# Patient Record
Sex: Male | Born: 1950 | Race: White | Hispanic: No | Marital: Married | Smoking: Never smoker
Health system: Southern US, Community
[De-identification: ages and names within clinical notes are randomized; demographics above are authoritative.]

## PROBLEM LIST (undated history)

## (undated) DIAGNOSIS — I10 Essential (primary) hypertension: Secondary | ICD-10-CM

## (undated) DIAGNOSIS — J45909 Unspecified asthma, uncomplicated: Secondary | ICD-10-CM

## (undated) DIAGNOSIS — G8929 Other chronic pain: Secondary | ICD-10-CM

## (undated) DIAGNOSIS — I469 Cardiac arrest, cause unspecified: Secondary | ICD-10-CM

## (undated) DIAGNOSIS — I509 Heart failure, unspecified: Secondary | ICD-10-CM

## (undated) DIAGNOSIS — D649 Anemia, unspecified: Secondary | ICD-10-CM

## (undated) DIAGNOSIS — I4891 Unspecified atrial fibrillation: Secondary | ICD-10-CM

## (undated) DIAGNOSIS — Z95810 Presence of automatic (implantable) cardiac defibrillator: Secondary | ICD-10-CM

## (undated) DIAGNOSIS — G4730 Sleep apnea, unspecified: Secondary | ICD-10-CM

## (undated) DIAGNOSIS — M5030 Other cervical disc degeneration, unspecified cervical region: Secondary | ICD-10-CM

## (undated) DIAGNOSIS — I428 Other cardiomyopathies: Secondary | ICD-10-CM

## (undated) HISTORY — PX: CARDIAC CATHETERIZATION: SHX172

## (undated) HISTORY — PX: LIPOMA EXCISION: SHX5283

## (undated) HISTORY — PX: HERNIA REPAIR: SHX51

## (undated) HISTORY — PX: INSERT / REPLACE / REMOVE PACEMAKER: SUR710

## (undated) HISTORY — PX: TONSILLECTOMY: SUR1361

---

## 2005-08-26 ENCOUNTER — Ambulatory Visit: Payer: Self-pay | Admitting: Nurse Practitioner

## 2006-12-13 ENCOUNTER — Emergency Department: Payer: Self-pay | Admitting: Emergency Medicine

## 2006-12-13 ENCOUNTER — Other Ambulatory Visit: Payer: Self-pay

## 2007-02-19 ENCOUNTER — Ambulatory Visit: Payer: Self-pay | Admitting: Internal Medicine

## 2010-01-25 ENCOUNTER — Inpatient Hospital Stay: Payer: Self-pay | Admitting: Internal Medicine

## 2011-05-26 DIAGNOSIS — G4730 Sleep apnea, unspecified: Secondary | ICD-10-CM | POA: Insufficient documentation

## 2011-05-26 DIAGNOSIS — M5030 Other cervical disc degeneration, unspecified cervical region: Secondary | ICD-10-CM | POA: Insufficient documentation

## 2011-05-26 DIAGNOSIS — E669 Obesity, unspecified: Secondary | ICD-10-CM | POA: Insufficient documentation

## 2011-05-26 DIAGNOSIS — I10 Essential (primary) hypertension: Secondary | ICD-10-CM | POA: Insufficient documentation

## 2011-05-26 DIAGNOSIS — E785 Hyperlipidemia, unspecified: Secondary | ICD-10-CM | POA: Insufficient documentation

## 2011-05-26 DIAGNOSIS — I4901 Ventricular fibrillation: Secondary | ICD-10-CM | POA: Insufficient documentation

## 2011-05-26 DIAGNOSIS — I428 Other cardiomyopathies: Secondary | ICD-10-CM | POA: Insufficient documentation

## 2011-08-29 HISTORY — PX: OTHER SURGICAL HISTORY: SHX169

## 2011-11-20 ENCOUNTER — Encounter: Payer: Self-pay | Admitting: Internal Medicine

## 2011-11-27 ENCOUNTER — Encounter: Payer: Self-pay | Admitting: Internal Medicine

## 2012-01-19 DIAGNOSIS — T82190A Other mechanical complication of cardiac electrode, initial encounter: Secondary | ICD-10-CM | POA: Insufficient documentation

## 2012-01-19 DIAGNOSIS — Z95810 Presence of automatic (implantable) cardiac defibrillator: Secondary | ICD-10-CM | POA: Insufficient documentation

## 2012-06-06 DIAGNOSIS — I4891 Unspecified atrial fibrillation: Secondary | ICD-10-CM | POA: Insufficient documentation

## 2012-08-08 ENCOUNTER — Ambulatory Visit: Payer: Self-pay | Admitting: Cardiology

## 2012-08-28 ENCOUNTER — Ambulatory Visit: Payer: Self-pay | Admitting: Cardiology

## 2012-09-17 LAB — BASIC METABOLIC PANEL
Anion Gap: 8 (ref 7–16)
BUN: 21 mg/dL — ABNORMAL HIGH (ref 7–18)
Chloride: 109 mmol/L — ABNORMAL HIGH (ref 98–107)
Co2: 20 mmol/L — ABNORMAL LOW (ref 21–32)
Creatinine: 0.85 mg/dL (ref 0.60–1.30)
Glucose: 111 mg/dL — ABNORMAL HIGH (ref 65–99)
Osmolality: 277 (ref 275–301)
Potassium: 4.1 mmol/L (ref 3.5–5.1)
Sodium: 137 mmol/L (ref 136–145)

## 2012-09-17 LAB — CBC
HGB: 13.5 g/dL (ref 13.0–18.0)
MCH: 32.5 pg (ref 26.0–34.0)
MCHC: 34.2 g/dL (ref 32.0–36.0)
MCV: 95 fL (ref 80–100)

## 2012-09-17 LAB — RAPID INFLUENZA A&B ANTIGENS

## 2012-09-18 ENCOUNTER — Inpatient Hospital Stay: Payer: Self-pay | Admitting: Internal Medicine

## 2012-09-18 LAB — URINALYSIS, COMPLETE
Bilirubin,UR: NEGATIVE
Blood: NEGATIVE
Glucose,UR: NEGATIVE mg/dL (ref 0–75)
Ketone: NEGATIVE
Leukocyte Esterase: NEGATIVE
Ph: 5 (ref 4.5–8.0)
Protein: NEGATIVE
Specific Gravity: 1.025 (ref 1.003–1.030)
WBC UR: 1 /HPF (ref 0–5)

## 2012-09-18 LAB — TROPONIN I: Troponin-I: 0.04 ng/mL

## 2012-09-19 LAB — CBC WITH DIFFERENTIAL/PLATELET
Basophil #: 0 10*3/uL (ref 0.0–0.1)
Eosinophil #: 0.5 10*3/uL (ref 0.0–0.7)
HGB: 13 g/dL (ref 13.0–18.0)
Lymphocyte %: 15 %
MCH: 32.8 pg (ref 26.0–34.0)
MCHC: 34.3 g/dL (ref 32.0–36.0)
Monocyte #: 1.2 x10 3/mm — ABNORMAL HIGH (ref 0.2–1.0)
Monocyte %: 11.9 %
Neutrophil #: 6.7 10*3/uL — ABNORMAL HIGH (ref 1.4–6.5)
Neutrophil %: 67.4 %
Platelet: 194 10*3/uL (ref 150–440)
RDW: 13.2 % (ref 11.5–14.5)
WBC: 9.9 10*3/uL (ref 3.8–10.6)

## 2012-09-19 LAB — BASIC METABOLIC PANEL
BUN: 9 mg/dL (ref 7–18)
Chloride: 108 mmol/L — ABNORMAL HIGH (ref 98–107)
Co2: 23 mmol/L (ref 21–32)
Creatinine: 0.83 mg/dL (ref 0.60–1.30)
EGFR (African American): >60
EGFR (Non-African Amer.): >60
Glucose: 92 mg/dL (ref 65–99)

## 2012-09-23 LAB — CULTURE, BLOOD (SINGLE)

## 2013-08-18 ENCOUNTER — Emergency Department: Payer: Self-pay | Admitting: Emergency Medicine

## 2013-08-18 LAB — COMPREHENSIVE METABOLIC PANEL
Albumin: 4.2 g/dL (ref 3.4–5.0)
Alkaline Phosphatase: 63 U/L
Anion Gap: 5 — ABNORMAL LOW (ref 7–16)
BUN: 23 mg/dL — ABNORMAL HIGH (ref 7–18)
Bilirubin,Total: 0.7 mg/dL (ref 0.2–1.0)
Chloride: 102 mmol/L (ref 98–107)
Co2: 31 mmol/L (ref 21–32)
Creatinine: 1.26 mg/dL (ref 0.60–1.30)
EGFR (African American): >60
EGFR (Non-African Amer.): >60
Potassium: 4.4 mmol/L (ref 3.5–5.1)
SGOT(AST): 15 U/L (ref 15–37)
SGPT (ALT): 22 U/L (ref 12–78)
Sodium: 138 mmol/L (ref 136–145)
Total Protein: 8.3 g/dL — ABNORMAL HIGH (ref 6.4–8.2)

## 2013-08-18 LAB — CBC
HCT: 43 % (ref 40.0–52.0)
HGB: 13.9 g/dL (ref 13.0–18.0)
MCH: 31 pg (ref 26.0–34.0)
MCHC: 32.3 g/dL (ref 32.0–36.0)
MCV: 96 fL (ref 80–100)
RBC: 4.48 10*6/uL (ref 4.40–5.90)
WBC: 23.2 10*3/uL — ABNORMAL HIGH (ref 3.8–10.6)

## 2013-08-18 LAB — CLOSTRIDIUM DIFFICILE(ARMC)

## 2013-08-18 LAB — LIPASE, BLOOD: Lipase: 208 U/L (ref 73–393)

## 2013-08-20 LAB — STOOL CULTURE

## 2014-07-24 ENCOUNTER — Emergency Department: Payer: Self-pay | Admitting: Emergency Medicine

## 2014-08-25 DIAGNOSIS — Z7901 Long term (current) use of anticoagulants: Secondary | ICD-10-CM | POA: Insufficient documentation

## 2014-09-28 DIAGNOSIS — D649 Anemia, unspecified: Secondary | ICD-10-CM | POA: Insufficient documentation

## 2014-09-28 DIAGNOSIS — R739 Hyperglycemia, unspecified: Secondary | ICD-10-CM | POA: Insufficient documentation

## 2014-09-28 DIAGNOSIS — J4520 Mild intermittent asthma, uncomplicated: Secondary | ICD-10-CM | POA: Insufficient documentation

## 2014-12-18 NOTE — H&P (Signed)
PATIENT NAME:  Henry Moreno, Henry Moreno MR#:  [id number] DATE OF BIRTH:  August 06, 1951  PRIMARY CARE PHYSICIAN:  Dr. Ramonita Lab   REFERRING PHYSICIAN:  Dr. Marjean Donna   CHIEF COMPLAINT:  Syncope.   HISTORY OF PRESENT ILLNESS:  This is a 64 year old male with history of dilated cardiomyopathy, status post V-fib arrest in 2011, status post biventricular pacer, ICD implantation at [facility].  By then he had an ejection fraction of 20%, last known ejection fraction 42% in October of last year, as well a recent diagnosis of A fib, started on anticoagulation with Eliquis; history of asthma, hypertension, hyperlipidemia, anemia, presents with syncope.  The patient reports he developed diarrhea yesterday.  He reports multiple family members had diarrhea over the last few days, mainly his wife, his son and other family members.  The patient reports yesterday afternoon he had a soft bowel movement x 2, then he started having profuse watery diarrhea, multiple episodes.  He started to feel dizzy and lightheaded.  He was at the bathroom at work when he had significant watery bowel movement and reports syncope, loss of consciousness and fall where he hit his right forehead area.  The patient was hypotensive initially with blood pressure 60/40, received a total of 3 liters IV normal saline, where blood pressure went up to 90/60.  The patient's first troponin was negative.  The patient had significant leukocytosis of 21,000.  CT of abdomen and pelvis with IV contrast done and did show only fluid and nondilated colon consistent with diarrhea and diverticulosis.  Hospitalist services were requested to admit the patient for appropriate hydration and further management and workup for syncope and diarrhea.    The patient reports he was recently started on Eliquis in October of last year, where he is being followed with [facility], as the interpretation of his AICD device showing him having some atrial fibrillation episodes, so he was  started on Eliquis for anticoagulation.   PAST MEDICAL HISTORY: 1.  Dilated cardiomyopathy, with last ejection fraction 42% as patient reporting, which was done at [facility] cardiology.  2.  Status post V-fib arrest in 2011, with status post AICD implantation in June 2011.  3.  Sleep apnea, on CPAP.  4.  Hyperlipidemia.  5.  Asthma. 6.  Hypertension.   PAST SURGICAL HISTORY:  1.  Hernia repair.  2.  Septoplasty.  3.  Tonsillectomy.  4.  AICD placement.   ALLERGIES:  IBUPROFEN AND ACE INHIBITORS.   HOME MEDICATIONS: 1.  Diovan 80 mg oral  2.  Zyrtec 10 mg oral daily.  3.  Coreg 12.5 mg p.o. b.i.d.  4.  Advair Diskus 1 puff 2 times a day.  5.  Multivitamin 1 tablet oral daily.  6.  Vitamin D3 at 2000 units oral daily.  7.  Eliquis 5 mg oral 2 times a day.  8.  Norco 1 to 2 tablets 4 times a day as needed.  9.  Zofran as needed.   SOCIAL HISTORY:  No alcohol, tobacco or illicit drug use.   FAMILY HISTORY:  The patient is adopted, unaware of the history of the family.   REVIEW OF SYSTEMS:  CONSTITUTIONAL:  The patient denies any fever, chills.  Complains of fatigue, generalized weakness.  EYES:  Denies blurry vision, double vision, inflammation or glaucoma.  EARS, NOSE, THROAT:  Denies tinnitus, ear pain, epistaxis or nasal discharge.  RESPIRATORY:  Denies cough, wheezing, hemoptysis, dyspnea, COPD.  CARDIOVASCULAR:  Denies chest pain, edema, palpitation.  Had syncope.  GASTROINTESTINAL:  Has complaints of nausea, diarrhea, abdominal pain.  Denies vomiting, hematemesis, jaundice, rectal bleed.  GENITOURINARY:  Denies dysuria, hematuria, renal colic.  ENDOCRINE:  Denies polyuria, polydipsia, heat or cold intolerance.  HEMATOLOGY:  Denies anemia, easy bruising, bleeding diathesis.  MUSCULOSKELETAL:  Denies any arthritis, cramps, swelling, gout.  NEUROLOGIC:  Denies CVA, TIA, seizures, dementia, headache, vertigo, ataxia.  PSYCHIATRIC:  Denies anxiety, insomnia, schizophrenia,  substance or alcohol abuse.   PHYSICAL EXAMINATION: VITAL SIGNS:  Temperature 99.5, pulse 79, respiratory rate 16, blood pressure 109/66, saturating 96% on room air.  GENERAL:  Well-nourished male, looks comfortable in bed, in no apparent distress.  HEENT:  Has right laceration in the eyebrow area, no active bleed.  Pupils equal, reactive to light.  Pink conjunctivae.  Anicteric sclerae.  Moist oral mucosa.  NECK:  Supple.  No thyromegaly.  No JVD.  CHEST:  Good air entry bilaterally.  No wheezing, rales, rhonchi.  CARDIOVASCULAR:  S1, S2 heard.  No rubs, murmur, gallops.  ABDOMEN:  Soft, nontender, nondistended.  Bowel sounds present.  EXTREMITIES:  No edema.  No clubbing.  No cyanosis.  PSYCHIATRIC:  Awake, alert, oriented x 3.  Intact judgment and insight.  NEUROLOGIC:  Cranial nerves grossly intact.  Motor 5 out of 5.  No focal deficits.  SKIN:  Normal skin turgor.  Warm and dry.  No rash.   PERTINENT LABORATORY DATA:  Glucose 111, BUN 21, creatinine 0.85, sodium 137, potassium 4.1, chloride 109, CO2 of 20, troponin 0.03.  White blood cells 21.4, hemoglobin 13.5, hematocrit 39.5, platelets 229.  Urinalysis negative.  CT abdomen and pelvis showing fluid in the nondilated colon consistent with diarrhea, diverticulosis, descending and sigmoid colon without evidence of diverticulitis.   ASSESSMENT AND PLAN: 1.  Syncope.  This is most likely related to volume depletion from significant watery diarrhea.  We will continue with IV hydration, but we will be very careful as patient is known to have history of cardiomyopathy.  As well, we will continue to hold his antihypertensive medication.  We will have him on telemonitor, and we will continue to cycle his troponins and follow that trend.  2.  Diarrhea.  This is most likely viral, as multiple family members have similar complaints and as per patient, it resolved within 24 hours.  CT abdomen and pelvis does not show any colitis, so we will not start  any antibiotic for now.  We will send for stool workup, culture and sensitivity, white blood cell and C. difficile.  Once it is negative, can be started on loperamide.  3.  Leukocytosis.  This is most likely related to his diarrhea.  Urinalysis is negative.  We will follow on the blood cultures.  4.  Recent diagnosis of atrial fibrillation.  The patient is on anticoagulation with Eliquis; will be resumed in a.m.  5.  Cardiomyopathy.  Will continue with gentle hydration.  Will be very careful with hydration due to cardiomyopathy.  Will resume beta-blockers and Diovan when the patient is stable.  The patient is status post automatic implantable cardiac defibrillator  6.  Hypertension.  Will hold all meds due to hypotensive and volume depletion.  7.  Deep vein thrombosis prophylaxis.  The patient is on full-dose anticoagulation with Eliquis.  8.  Gastrointestinal prophylaxis.  Will start on PPI.  9.  CODE STATUS:  FULL CODE.   TOTAL TIME SPENT ON ADMISSION AND PATIENT CARE:  60 minutes.    ____________________________ Albertine Patricia, MD dse:ea D:  09/18/2012 06:11:00 ET T: 09/18/2012 07:09:28 ET JOB#: [id number]  cc: Albertine Patricia, MD, <Dictator> Edvardo Honse Graciela Husbands MD ELECTRONICALLY SIGNED 09/19/2012 0:51

## 2014-12-18 NOTE — Discharge Summary (Signed)
PATIENT NAME:  Henry Moreno, Henry Moreno MR#:  [id number] DATE OF BIRTH:  1950/12/22  DATE OF ADMISSION:  09/18/2012 DATE OF DISCHARGE:  09/19/2012  FINAL DIAGNOSES: 1. Syncope secondary to dehydration.  2. Dehydration secondary to viral gastroenteritis.  3. Chronic left-sided systolic congestive heart failure.  4. History of paroxysmal atrial fibrillation.  5. Remote history of ventricular fibrillation arrest, status post AICD implantation.  6. Sleep apnea, on CPAP.  7. Hyperlipidemia.  8. Reactive airway disease.  9. Hypertension.   HISTORY AND PHYSICAL: Please see dictated admission history and physical.   [place]: The patient was admitted after an episode of syncope and had been found to have hypotension, systolic blood pressure in the 60 range. His normal systolic blood pressure is 90 to 110. He was placed on IV fluids, with improvement in his blood pressure. His blood pressure medications were initially held. Cardiac enzymes were followed, which were unremarkable. Cardiology consultation was obtained, and AICD was interrogated, showing no evidence of dysrhythmia. He had had nausea, vomiting and diarrhea at home, and in fact was having diarrhea when he had his syncopal episode. His wife and son both had similar symptoms over the prior 48 hours. During the patient's hospitalization he had no diarrhea, no further nausea. He was tolerating diet. I ambulated him personally over 110 feet and he had no issues with this. He felt ready to return home, so at this time he will be discharged home in stable condition. His physical activity will be up as tolerated. He will follow a 2 gram sodium diet, although he should keep his diet bland for the next 3 to 4 days. He should weigh himself daily, calling for more than 2 pound gain in one day or 5 pounds in one week or increasing signs or symptoms of heart failure including weight gain, fatigue, edema, dyspnea, and he is familiar with these symptoms.    DISCHARGE MEDICATIONS: 1. Advair 100/50 one puff 2 times a day. 2. Carvedilol 12.5 mg p.o. 2 times a day. 3. Eliquis 5 mg p.o. 2 times a day. 4. Vitamin D3 2000 units p.o. twice a day.  5. Zyrtec 10 mg p.o. daily.  6. Norco 5/325 mg 1 to 2 p.o. every 4 hours p.r.n. pain. 7. Multivitamin 1 p.o. daily.  8. Diovan 80 mg p.o. daily, frequency reduced.  9. Pantoprazole 40 mg daily x 7 days.   The patient will follow up with [place] in 7 to 10 days. He knows to call or return sooner for new or worsening symptoms. He may return to work on Monday, 09/23/2012.  ____________________________ [name], MD bjk:sb D: 09/19/2012 08:01:39 ET T: 09/19/2012 08:23:54 ET JOB#: [id number]  cc: [name], MD, <Dictator> Ramonita Lab MD ELECTRONICALLY SIGNED 09/23/2012 19:58

## 2014-12-18 NOTE — Consult Note (Signed)
General Aspect 64 year old male with history of normal coronary arteries, nonischemic cardiomyopathy with EF of 30%, status post V. fib arrest status post ICD, history of sleep apnea that was hospitalized for syncope yesterday. Several family members have had GI distress, thought to be viral. The patient woke yesterday a.m. feeling fine and ate his usual breakfast.  Midmorning had a loose stool.  At lunch, was feeling queasy and did not eat lunch.  Midafternoon had several watery stools with syncope..  Patient did start feeling weak, lightheaded and then was aware  of being in th  the floor. He was quite hypotensive on presentation to the ER, but his blood pressure did improve after fluids.  He does have a small bruise  the side of his left eye where he hit his head. He is currently eating some of this lunch, but his appetite is still not back to usual.  His wife is concerned because he looks pale. No defibrillator firings.   Physical Exam:   GEN no acute distress, Pale but warm and dry.    HEENT pink conjunctivae, moist oral mucosa    RESP normal resp effort  clear BS    CARD Regular rate and rhythm    EXTR negative edema    SKIN normal to palpation    NEURO cranial nerves intact, motor/sensory function intact    PSYCH alert, A+O to time, place, person, good insight   Review of Systems:   Subjective/Chief Complaint None at present    Cardiovascular: No Complaints    Gastrointestinal: Diarrhea  Queasy    ROS Pt not able to provide ROS    Medications/Allergies Reviewed Medications/Allergies reviewed   Radiology Results: XRay:    21-Jan-14 23:36, Chest PA and Lateral   Chest PA and Lateral    REASON FOR EXAM:    PERSISTENT HYPOTENSION  COMMENTS:       PROCEDURE: DXR - DXR CHEST PA (OR AP) AND LATERAL  - Sep 17 2012 11:36PM     RESULT: Comparison: 01/25/2010    Findings:  The heart and mediastinum are stable. Interval placement of multilead  AICD wires. No focal pulmonary  opacities.    IMPRESSION:   No acute cardiopulmonary disease.    Dictation site: 2    Verified By: Gregor Hams, M.D., MD  CT:    21-Jan-14 23:36, CT Abdomen and Pelvis With Contrast   CT Abdomen and Pelvis With Contrast    REASON FOR EXAM:    (1) ABD PAIN AND LOW BLOOD PRESSURE; (2) ABD PAIN  COMMENTS:       PROCEDURE: CT  - CT ABDOMEN / PELVIS  W  - Sep 17 2012 11:36PM     RESULT:     TECHNIQUE: Emergent CT of the abdomen and pelvis is performed with 100 ml   of Isovue-300 iodinated intravenous contrast. Images are reconstructed at   3.0 mm slice thickness in the axial plane.     There is no previous exam for comparison.     FINDINGS: There is dependent atelectasis in the lower lobes. The lung   bases are otherwise clear and fully inflated. The liver, spleen,     pancreas, gallbladder, abdominal aorta and adrenal glands appear to be   within normal limits. The kidneys show no hydronephrosis. There is a   small peripheral cyst laterally in the mid to upper leftkidney measuring   approximately 1.7 cm with a similar sized cyst posteriorly in the mid   left  kidney. The right kidney shows no solid or cystic mass. No stones   are seen in either kidney. The stomach contains some fluid. There is   fluid within loops of small and large bowel without formed stool seen in   the colon. The urinary bladder and prostate appear unremarkable.   Scattered colonic diverticulosis is seen in the descending colon and   sigmoid colon regions without evidence of acute diverticulitis. No   adenopathy or evidence of hemorrhage is seen. There is no bowel   obstruction or perforation evident. No radiopaque gallstones are evident.    IMPRESSION:    1. Scattered colonic diverticulosis.   2. Left renal cysts.   3. Bilateral lung base atelectasis.    Dictation Site: 1      Thank you for this opportunity to contribute to the care of your patient.         Verified By: Sundra Aland,  M.D., MD    ASA: Hives  [place]bu: Rash  Sulfa drugs: Unknown  Ace Inhibitors: Unknown  Vital Signs/Nurse's Notes: **Vital Signs.:   22-Jan-14 11:15   Vital Signs Type Routine   Temperature Temperature (F) 98.1   Celsius 36.7   Temperature Source Oral   Pulse Pulse 83   Respirations Respirations 18   Systolic BP Systolic BP 790   Diastolic BP (mmHg) Diastolic BP (mmHg) 62   Pulse Lying Pulse Lying 83   Systolic BP Systolic BP 240   Diastolic BP (mmHg) Diastolic BP (mmHg) 62   Pulse Pulse Sitting 81   Systolic BP Systolic BP 973   Diastolic BP (mmHg) Diastolic BP (mmHg) 66   Pulse Standing Pulse Standing 72   Pulse Ox % Pulse Ox % 91   Pulse Ox Activity Level  At rest   Oxygen Delivery Room Air/ 21 %     Impression 64 year old male with known ischemic cardiomyopathy, status post V. fib arrest and defibrillator placement with syncope thought to be due to volume depletion from diarrhea and GI virus but needing further cardiac evaluation with defibrillator interrogation to assess for any  arrhythmia  that could have contributed to syncope.    Plan 1. Request has been placed for [employer] rep to interrogate device.  Further recommendations once interrogation results are known. 2.  Otherwise, continue current therapy for probable volume depletion from viral illness.  Patient was seen in collaboration with Dr. Nehemiah Massed.   Electronic Signatures: Roderic Palau (NP)  (Signed 22-Jan-14 14:57)  Authored: General Aspect/Present Illness, History and Physical Exam, Review of System, Radiology, Allergies, Vital Signs/Nurse's Notes, Impression/Plan   Last Updated: 22-Jan-14 14:57 by Roderic Palau (NP)

## 2015-10-08 DIAGNOSIS — I5042 Chronic combined systolic (congestive) and diastolic (congestive) heart failure: Secondary | ICD-10-CM | POA: Insufficient documentation

## 2015-11-10 ENCOUNTER — Encounter: Payer: 59 | Attending: Internal Medicine | Admitting: *Deleted

## 2015-11-10 VITALS — Ht 67.0 in | Wt 227.0 lb

## 2015-11-10 DIAGNOSIS — I5042 Chronic combined systolic (congestive) and diastolic (congestive) heart failure: Secondary | ICD-10-CM | POA: Diagnosis present

## 2015-11-11 NOTE — Patient Instructions (Signed)
Patient Instructions  Patient Details  Name: Shaquon Mcclenney MRN: [id number] Date of Birth: October 14, 1950 Referring Provider:  [name], MD  Below are the personal goals you chose as well as exercise and nutrition goals. Our goal is to help you keep on track towards obtaining and maintaining your goals. We will be discussing your progress on these goals with you throughout the program.  Initial Exercise Prescription:     Initial Exercise Prescription - 11/10/15 1600    Date of Initial Exercise Prescription   Date 11/10/15   Treadmill   MPH 2   Grade 0   Minutes 15   Bike   Level 1   Minutes 15   Recumbant Bike   Level 2   RPM 15   Minutes 15   NuStep   Level 2   Watts 30   Minutes 15   Recumbant Elliptical   Level 2   RPM 30   Minutes 15   REL-XR   Level 2   Watts 40   Minutes 15   T5 Nustep   Level 2   Watts 30   Minutes 15   Biostep-RELP   Level 2   Watts 30   Minutes 15   Prescription Details   Frequency (times per week) 3   Duration Progress to 45 minutes of aerobic exercise without signs/symptoms of physical distress   Intensity   THRR REST +  30   Ratings of Perceived Exertion 11-15   Progression   Progression Continue to progress workloads to maintain intensity without signs/symptoms of physical distress.   Resistance Training   Training Prescription Yes   Weight 3   Reps 10-15      Exercise Goals: Frequency: Be able to perform aerobic exercise three times per week working toward 3-5 days per week.  Intensity: Work with a perceived exertion of 11 (fairly light) - 15 (hard) as tolerated. Follow your new exercise prescription and watch for changes in prescription as you progress with the program. Changes will be reviewed with you when they are made.  Duration: You should be able to do 30 minutes of continuous aerobic exercise in addition to a 5 minute warm-up and a 5 minute cool-down routine.  Nutrition Goals: Your personal nutrition  goals will be established when you do your nutrition analysis with the dietician.  The following are nutrition guidelines to follow: Cholesterol < 200mg /day Sodium < 1500mg /day Fiber: Men over 50 yrs - 30 grams per day  Personal Goals:     Personal Goals and Risk Factors at Admission - 11/11/15 0951    Core Components/Risk Factors/Patient Goals on Admission    Weight Management Yes;Obesity   Intervention Weight Management: Develop a combined nutrition and exercise program designed to reach desired caloric intake, while maintaining appropriate intake of nutrient and fiber, sodium and fats, and appropriate energy expenditure required for the weight goal.;Weight Management: Provide education and appropriate resources to help participant work on and attain dietary goals.;Weight Management/Obesity: Establish reasonable short term and long term weight goals.;Obesity: Provide education and appropriate resources to help participant work on and attain dietary goals.   Admit Weight 220 lb (99.791 kg)   Goal Weight: Short Term 216 lb (97.977 kg)   Goal Weight: Long Term 174 lb (78.926 kg)   Expected Outcomes Short Term: Continue to assess and modify interventions until short term weight is achieved.;Long Term: Adherence to nutrition and physical activity/exercise program aimed toward attainment of established weight goal.  Sedentary Yes   Intervention Provide advice, education, support and counseling about physical activity/exercise needs.;Develop an individualized exercise prescription for aerobic and resistive training based on initial evaluation findings, risk stratification, comorbidities and participant's personal goals.   Expected Outcomes Achievement of increased cardiorespiratory fitness and enhanced flexibility, muscular endurance and strength shown through measurements of functional capaciy and personal statement of participant.   Hypertension Yes   Intervention Provide education on lifestyle  modifcations including regular physical activity/exercise, weight management, moderate sodium restriction and increased consumption of fresh fruit, vegetables, and low fat dairy, alcohol moderation, and smoking cessation.;Monitor prescription use compliance.   Expected Outcomes Short Term: Continued assessment and intervention until BP is < 140/66mm HG in hypertensive participants. < 130/61mm HG in hypertensive participants with diabetes, heart failure or chronic kidney disease.;Long Term: Maintenance of blood pressure at goal levels.   Lipids Yes   Intervention Provide education and support for participant on nutrition & aerobic/resistive exercise along with prescribed medications to achieve LDL 70mg , HDL >40mg .   Expected Outcomes Short Term: Participant states understanding of desired cholesterol values and is compliant with medications prescribed. Participant is following exercise prescription and nutrition guidelines.;Long Term: Cholesterol controlled with medications as prescribed, with individualized exercise RX and with personalized nutrition plan. Value goals: LDL < 70mg , HDL > 40 mg.   Personal Goal Other Yes   Personal Goal Improve my quality of life   Intervention Provide nutrition and exercise guidelines as with other goals   Expected Outcomes Increase in quality of life as verbalized by more energy to go home and spend time with my family.      Tobacco Use Initial Evaluation: History  Smoking status  . Not on file  Smokeless tobacco  . Not on file    Copy of goals given to participant.

## 2015-11-11 NOTE — Progress Notes (Signed)
Cardiac Individual Treatment Plan  Patient Details  Name: Henry Moreno MRN: [id number] Date of Birth: [birth year] Referring Provider:  [name], MD  Initial Encounter Date: Date: 11/10/15  Visit Diagnosis: Heart failure, systolic and diastolic, chronic ([place])  Patient's Home Medications on Admission:  Current outpatient prescriptions:  .  acetaminophen (TYLENOL) 500 MG tablet, Take 1,000 mg by mouth 2 (two) times daily., Disp: , Rfl:  .  apixaban (ELIQUIS) 5 MG TABS tablet, Take by mouth., Disp: , Rfl:  .  carvedilol (COREG) 25 MG tablet, , Disp: , Rfl:  .  cetirizine (ZYRTEC) 10 MG tablet, Take by mouth., Disp: , Rfl:  .  Cholecalciferol (VITAMIN D3) 3000 units TABS, Take 1 capsule by mouth 2 (two) times daily. Taking a  2000mg  dose, Disp: , Rfl:  .  Multiple Vitamin (MULTI VITAMIN DAILY) TABS, Take by mouth., Disp: , Rfl:  .  valsartan (DIOVAN) 80 MG tablet, , Disp: , Rfl:  .  Magnesium 200 MG TABS, Take by mouth., Disp: , Rfl:   Past Medical History: No past medical history on file.  Tobacco Use: History  Smoking status  . Not on file  Smokeless tobacco  . Not on file    Labs: Recent Review Flowsheet Data    There is no flowsheet data to display.       Exercise Target Goals: Date: 11/10/15  Exercise Program Goal: Individual exercise prescription set with THRR, safety & activity barriers. Participant demonstrates ability to understand and report RPE using BORG scale, to self-measure pulse accurately, and to acknowledge the importance of the exercise prescription.  Exercise Prescription Goal: Starting with aerobic activity 30 plus minutes a day, 3 days per week for initial exercise prescription. Provide home exercise prescription and guidelines that participant acknowledges understanding prior to discharge.  Activity Barriers & Risk Stratification:     Activity Barriers & Cardiac Risk Stratification - 11/10/15 1321    Activity Barriers & Cardiac Risk  Stratification   Activity Barriers Joint Problems;Deconditioning  Right shoulder/arm injury being followed by Dr Caryl Comes.   Still needs full evaluation. ICD  and other heart issues have taken precedent over this arm issue.   Cardiac Risk Stratification High      6 Minute Walk:     6 Minute Walk      11/10/15 1636 11/11/15 0950     6 Minute Walk   Phase Initial     Distance 1200 feet 1200 feet    Walk Time 6 minutes 6 minutes    MPH 2.3 2.3    RPE 12 12    Symptoms No No    Resting HR 78 bpm 78 bpm    Max Ex. HR 116 bpm 116 bpm       Initial Exercise Prescription:     Initial Exercise Prescription - 11/10/15 1600    Date of Initial Exercise Prescription   Date 11/10/15   Treadmill   MPH 2   Grade 0   Minutes 15   Bike   Level 1   Minutes 15   Recumbant Bike   Level 2   RPM 15   Minutes 15   NuStep   Level 2   Watts 30   Minutes 15   Recumbant Elliptical   Level 2   RPM 30   Minutes 15   REL-XR   Level 2   Watts 40   Minutes 15   T5 Nustep   Level 2   Watts 30  Minutes 15   Biostep-RELP   Level 2   Watts 30   Minutes 15   Prescription Details   Frequency (times per week) 3   Duration Progress to 45 minutes of aerobic exercise without signs/symptoms of physical distress   Intensity   THRR REST +  30   Ratings of Perceived Exertion 11-15   Progression   Progression Continue to progress workloads to maintain intensity without signs/symptoms of physical distress.   Resistance Training   Training Prescription Yes   Weight 3   Reps 10-15      Exercise Prescription Changes:   Discharge Exercise Prescription (Final Exercise Prescription Changes):   Nutrition:  Target Goals: Understanding of nutrition guidelines, daily intake of sodium 1500mg , cholesterol 200mg , calories 30% from fat and 7% or less from saturated fats, daily to have 5 or more servings of fruits and vegetables.  Biometrics:     Pre Biometrics - 11/10/15 1640    Pre  Biometrics   Height 5\' 7"  (1.702 m)   Weight 227 lb (102.967 kg)   Waist Circumference 47 inches   Hip Circumference 48.25 inches   Waist to Hip Ratio 0.97 %   BMI (Calculated) 35.6       Nutrition Therapy Plan and Nutrition Goals:     Nutrition Therapy & Goals - 11/10/15 1300    Intervention Plan   Intervention Prescribe, educate and counsel regarding individualized specific dietary modifications aiming towards targeted core components such as weight, hypertension, lipid management, diabetes, heart failure and other comorbidities.;Nutrition handout(s) given to patient.   Expected Outcomes Short Term Goal: Understand basic principles of dietary content, such as calories, fat, sodium, cholesterol and nutrients.;Short Term Goal: A plan has been developed with personal nutrition goals set during dietitian appointment.;Long Term Goal: Adherence to prescribed nutrition plan.      Nutrition Discharge: Rate Your Plate Scores:   Nutrition Goals Re-Evaluation:   Psychosocial: Target Goals: Acknowledge presence or absence of depression, maximize coping skills, provide positive support system. Participant is able to verbalize types and ability to use techniques and skills needed for reducing stress and depression.  Initial Review & Psychosocial Screening:     Initial Psych Review & Screening - 11/11/15 0952    Initial Review   Current issues with Current Stress Concerns   Source of Stress Concerns Transportation;Chronic Illness   Family Dynamics   Good Support System? Yes   Screening Interventions   Interventions Encouraged to exercise      Quality of Life Scores:     Quality of Life - 11/10/15 1511    Quality of Life Scores   Health/Function Pre 19.87 %   Socioeconomic Pre 22.93 %   Psych/Spiritual Pre 23.86 %   Family Pre 30 %   GLOBAL Pre 22.81 %      PHQ-9:     Recent Review Flowsheet Data    Depression screen [facility] 2/9 11/10/2015   Decreased Interest 0   Down,  Depressed, Hopeless 0   PHQ - 2 Score 0   Altered sleeping 0   Tired, decreased energy 3   Change in appetite 0   Feeling bad or failure about yourself  0   Trouble concentrating 0   Moving slowly or fidgety/restless 0   Suicidal thoughts 0   PHQ-9 Score 3   Difficult doing work/chores Somewhat difficult      Psychosocial Evaluation and Intervention:   Psychosocial Re-Evaluation:   Vocational Rehabilitation: Provide vocational rehab assistance to qualifying candidates.  Vocational Rehab Evaluation & Intervention:   Education: Education Goals: Education classes will be provided on a weekly basis, covering required topics. Participant will state understanding/return demonstration of topics presented.  Learning Barriers/Preferences:     Learning Barriers/Preferences - 11/10/15 1323    Learning Barriers/Preferences   Learning Barriers None   Learning Preferences None      Education Topics: General Nutrition Guidelines/Fats and Fiber: -Group instruction provided by verbal, written material, models and posters to present the general guidelines for heart healthy nutrition. Gives an explanation and review of dietary fats and fiber.   Controlling Sodium/Reading Food Labels: -Group verbal and written material supporting the discussion of sodium use in heart healthy nutrition. Review and explanation with models, verbal and written materials for utilization of the food label.   Exercise Physiology & Risk Factors: - Group verbal and written instruction with models to review the exercise physiology of the cardiovascular system and associated critical values. Details cardiovascular disease risk factors and the goals associated with each risk factor.   Aerobic Exercise & Resistance Training: - Gives group verbal and written discussion on the health impact of inactivity. On the components of aerobic and resistive training programs and the benefits of this training and how to  safely progress through these programs.   Flexibility, Balance, General Exercise Guidelines: - Provides group verbal and written instruction on the benefits of flexibility and balance training programs. Provides general exercise guidelines with specific guidelines to those with heart or lung disease. Demonstration and skill practice provided.   Stress Management: - Provides group verbal and written instruction about the health risks of elevated stress, cause of high stress, and healthy ways to reduce stress.   Depression: - Provides group verbal and written instruction on the correlation between heart/lung disease and depressed mood, treatment options, and the stigmas associated with seeking treatment.   Anatomy & Physiology of the Heart: - Group verbal and written instruction and models provide basic cardiac anatomy and physiology, with the coronary electrical and arterial systems. Review of: AMI, Angina, Valve disease, Heart Failure, Cardiac Arrhythmia, Pacemakers, and the ICD.   Cardiac Procedures: - Group verbal and written instruction and models to describe the testing methods done to diagnose heart disease. Reviews the outcomes of the test results. Describes the treatment choices: Medical Management, Angioplasty, or Coronary Bypass Surgery.   Cardiac Medications: - Group verbal and written instruction to review commonly prescribed medications for heart disease. Reviews the medication, class of the drug, and side effects. Includes the steps to properly store meds and maintain the prescription regimen.   Go Sex-Intimacy & Heart Disease, Get SMART - Goal Setting: - Group verbal and written instruction through game format to discuss heart disease and the return to sexual intimacy. Provides group verbal and written material to discuss and apply goal setting through the application of the S.M.A.R.T. Method.   Other Matters of the Heart: - Provides group verbal, written materials and  models to describe Heart Failure, Angina, Valve Disease, and Diabetes in the realm of heart disease. Includes description of the disease process and treatment options available to the cardiac patient.   Exercise & Equipment Safety: - Individual verbal instruction and demonstration of equipment use and safety with use of the equipment.          Cardiac Rehab from 11/10/2015 in [facility] Cardiac and Pulmonary Rehab   Date  11/10/15   Educator  Sb   Instruction Review Code  2- meets goals/outcomes  Infection Prevention: - Provides verbal and written material to individual with discussion of infection control including proper hand washing and proper equipment cleaning during exercise session.      Cardiac Rehab from 11/10/2015 in [facility] Cardiac and Pulmonary Rehab   Date  11/10/15   Educator  Sb   Instruction Review Code  2- meets goals/outcomes      Falls Prevention: - Provides verbal and written material to individual with discussion of falls prevention and safety.      Cardiac Rehab from 11/10/2015 in [facility] Cardiac and Pulmonary Rehab   Date  11/10/15   Educator  SB   Instruction Review Code  2- meets goals/outcomes      Diabetes: - Individual verbal and written instruction to review signs/symptoms of diabetes, desired ranges of glucose level fasting, after meals and with exercise. Advice that pre and post exercise glucose checks will be done for 3 sessions at entry of program.    Knowledge Questionnaire Score:     Knowledge Questionnaire Score - 11/10/15 1323    Knowledge Questionnaire Score   Pre Score 24/28      Personal Goals and Risk Factors at Admission:     Personal Goals and Risk Factors at Admission - 11/11/15 0951    Core Components/Risk Factors/Patient Goals on Admission    Weight Management Yes;Obesity   Intervention Weight Management: Develop a combined nutrition and exercise program designed to reach desired caloric intake, while maintaining appropriate  intake of nutrient and fiber, sodium and fats, and appropriate energy expenditure required for the weight goal.;Weight Management: Provide education and appropriate resources to help participant work on and attain dietary goals.;Weight Management/Obesity: Establish reasonable short term and long term weight goals.;Obesity: Provide education and appropriate resources to help participant work on and attain dietary goals.   Admit Weight 220 lb (99.791 kg)   Goal Weight: Short Term 216 lb (97.977 kg)   Goal Weight: Long Term 174 lb (78.926 kg)   Expected Outcomes Short Term: Continue to assess and modify interventions until short term weight is achieved.;Long Term: Adherence to nutrition and physical activity/exercise program aimed toward attainment of established weight goal.   Sedentary Yes   Intervention Provide advice, education, support and counseling about physical activity/exercise needs.;Develop an individualized exercise prescription for aerobic and resistive training based on initial evaluation findings, risk stratification, comorbidities and participant's personal goals.   Expected Outcomes Achievement of increased cardiorespiratory fitness and enhanced flexibility, muscular endurance and strength shown through measurements of functional capaciy and personal statement of participant.   Hypertension Yes   Intervention Provide education on lifestyle modifcations including regular physical activity/exercise, weight management, moderate sodium restriction and increased consumption of fresh fruit, vegetables, and low fat dairy, alcohol moderation, and smoking cessation.;Monitor prescription use compliance.   Expected Outcomes Short Term: Continued assessment and intervention until BP is < 140/92mm HG in hypertensive participants. < 130/58mm HG in hypertensive participants with diabetes, heart failure or chronic kidney disease.;Long Term: Maintenance of blood pressure at goal levels.   Lipids Yes    Intervention Provide education and support for participant on nutrition & aerobic/resistive exercise along with prescribed medications to achieve LDL 70mg , HDL >40mg .   Expected Outcomes Short Term: Participant states understanding of desired cholesterol values and is compliant with medications prescribed. Participant is following exercise prescription and nutrition guidelines.;Long Term: Cholesterol controlled with medications as prescribed, with individualized exercise RX and with personalized nutrition plan. Value goals: LDL < 70mg , HDL > 40 mg.   Personal Goal  Other Yes   Personal Goal Improve my quality of life   Intervention Provide nutrition and exercise guidelines as with other goals   Expected Outcomes Increase in quality of life as verbalized by more energy to go home and spend time with my family.      Personal Goals and Risk Factors Review:    Personal Goals Discharge (Final Personal Goals and Risk Factors Review):    ITP Comments:     ITP Comments      11/10/15 1249           ITP Comments Medical review completed   Initial ITP continue with ITP          Comments:

## 2015-11-15 DIAGNOSIS — I5042 Chronic combined systolic (congestive) and diastolic (congestive) heart failure: Secondary | ICD-10-CM

## 2015-11-15 NOTE — Progress Notes (Signed)
Daily Session Note  Patient Details  Name: Aadit Hagood MRN: [id number] Date of Birth: 1951-05-13 Referring Provider:  [name], MD  Encounter Date: 11/15/2015  Check In:     Session Check In - 11/15/15 1615    Check-In   Location [facility]-Cardiac & Pulmonary Rehab   Staff Present Heath Lark, RN, BSN, CCRP;Chariti Havel, BS, ACSM EP-C, Exercise Physiologist;Carroll Enterkin, RN, BSN   Supervising physician immediately available to respond to emergencies See telemetry face sheet for immediately available ER MD   Medication changes reported     No   Fall or balance concerns reported    No   Warm-up and Cool-down Performed on first and last piece of equipment   Resistance Training Performed No   VAD Patient? No   Pain Assessment   Currently in Pain? No/denies         Goals Met:  Proper associated with RPD/PD & O2 Sat Exercise tolerated well No report of cardiac concerns or symptoms Strength training completed today  Goals Unmet:  Not Applicable  Comments:    Dr. Emily Filbert is Medical Director for [facility] and [facility].

## 2015-11-17 ENCOUNTER — Encounter: Payer: 59 | Admitting: *Deleted

## 2015-11-17 DIAGNOSIS — I5042 Chronic combined systolic (congestive) and diastolic (congestive) heart failure: Secondary | ICD-10-CM | POA: Diagnosis not present

## 2015-11-17 NOTE — Progress Notes (Signed)
Daily Session Note  Patient Details  Name: Henry Moreno MRN: [id number] Date of Birth: [birth year] Referring Provider:  [name], MD  Encounter Date: 11/17/2015  Check In:     Session Check In - 11/17/15 1610    Check-In   Location [facility]-Cardiac & Pulmonary Rehab   Staff Present Roanna Epley, RN, Drusilla Kanner, MS, ACSM CEP, Exercise Physiologist;Carroll Enterkin, RN, BSN   Supervising physician immediately available to respond to emergencies See telemetry face sheet for immediately available ER MD   Medication changes reported     No   Fall or balance concerns reported    No   Warm-up and Cool-down Performed on first and last piece of equipment   Resistance Training Performed Yes   VAD Patient? No   Pain Assessment   Currently in Pain? No/denies   Multiple Pain Sites No           Exercise Prescription Changes - 11/17/15 1600    Exercise Review   Progression Yes   Response to Exercise   Symptoms None   Comments Reviewed individualized exercise prescription and made increases per departmental policy. Exercise increases were discussed with the patient and they were able to perform the new work loads without issue (no signs or symptoms).    Duration Progress to 30 minutes of continuous aerobic without signs/symptoms of physical distress   Intensity Rest + 30   Progression   Progression Continue progressive overload as per policy without signs/symptoms or physical distress.   Resistance Training   Training Prescription Yes   Weight 3   Reps 10-15   Treadmill   MPH 2   Grade 0   Minutes 15   Bike   Level 1   Minutes 15   Recumbant Bike   Level 2   RPM 15   Minutes 15   NuStep   Level 2   Watts 30   Minutes 15   Recumbant Elliptical   Level 2   RPM 30   Minutes 15   REL-XR   Level 2   Watts 40   Minutes 15   T5 Nustep   Level 2   Watts 30   Minutes 15   Biostep-RELP   Level 3   Watts 25   Minutes 15      Goals Met:  Independence  with exercise equipment Exercise tolerated well Personal goals reviewed No report of cardiac concerns or symptoms Strength training completed today  Goals Unmet:  Not Applicable  Comments: Patient completed exercise prescription and all exercise goals during rehab session. The exercise was tolerated well and the patient is progressing in the program.    Dr. Emily Filbert is Medical Director for [facility] and [facility].

## 2015-11-18 ENCOUNTER — Encounter: Payer: Self-pay | Admitting: *Deleted

## 2015-11-18 DIAGNOSIS — I5042 Chronic combined systolic (congestive) and diastolic (congestive) heart failure: Secondary | ICD-10-CM

## 2015-11-18 NOTE — Progress Notes (Signed)
Cardiac Individual Treatment Plan  Patient Details  Name: Henry Moreno MRN: [id number] Date of Birth: 1951-03-21 Referring Provider:  [name], MD  Initial Encounter Date:       Cardiac Rehab from 11/10/2015 in [facility] Cardiac and Pulmonary Rehab   Date  11/10/15      Visit Diagnosis: Heart failure, systolic and diastolic, chronic ([place])  Patient's Home Medications on Admission:  Current outpatient prescriptions:  .  acetaminophen (TYLENOL) 500 MG tablet, Take 1,000 mg by mouth 2 (two) times daily., Disp: , Rfl:  .  apixaban (ELIQUIS) 5 MG TABS tablet, Take by mouth., Disp: , Rfl:  .  carvedilol (COREG) 25 MG tablet, , Disp: , Rfl:  .  cetirizine (ZYRTEC) 10 MG tablet, Take by mouth., Disp: , Rfl:  .  Cholecalciferol (VITAMIN D3) 3000 units TABS, Take 1 capsule by mouth 2 (two) times daily. Taking a  2000mg  dose, Disp: , Rfl:  .  Magnesium 200 MG TABS, Take by mouth., Disp: , Rfl:  .  Multiple Vitamin (MULTI VITAMIN DAILY) TABS, Take by mouth., Disp: , Rfl:  .  valsartan (DIOVAN) 80 MG tablet, , Disp: , Rfl:   Past Medical History: No past medical history on file.  Tobacco Use: History  Smoking status  . Not on file  Smokeless tobacco  . Not on file    Labs: Recent Review Flowsheet Data    There is no flowsheet data to display.       Exercise Target Goals:    Exercise Program Goal: Individual exercise prescription set with THRR, safety & activity barriers. Participant demonstrates ability to understand and report RPE using BORG scale, to self-measure pulse accurately, and to acknowledge the importance of the exercise prescription.  Exercise Prescription Goal: Starting with aerobic activity 30 plus minutes a day, 3 days per week for initial exercise prescription. Provide home exercise prescription and guidelines that participant acknowledges understanding prior to discharge.  Activity Barriers & Risk Stratification:     Activity Barriers & Cardiac Risk  Stratification - 11/10/15 1321    Activity Barriers & Cardiac Risk Stratification   Activity Barriers Joint Problems;Deconditioning  Right shoulder/arm injury being followed by Dr Caryl Comes.   Still needs full evaluation. ICD  and other heart issues have taken precedent over this arm issue.   Cardiac Risk Stratification High      6 Minute Walk:     6 Minute Walk      11/10/15 1636 11/11/15 0950     6 Minute Walk   Phase Initial     Distance 1200 feet 1200 feet    Walk Time 6 minutes 6 minutes    MPH 2.3 2.3    RPE 12 12    Symptoms No No    Resting HR 78 bpm 78 bpm    Max Ex. HR 116 bpm 116 bpm       Initial Exercise Prescription:     Initial Exercise Prescription - 11/10/15 1600    Date of Initial Exercise Prescription   Date 11/10/15   Treadmill   MPH 2   Grade 0   Minutes 15   Bike   Level 1   Minutes 15   Recumbant Bike   Level 2   RPM 15   Minutes 15   NuStep   Level 2   Watts 30   Minutes 15   Recumbant Elliptical   Level 2   RPM 30   Minutes 15   REL-XR  Level 2   Watts 40   Minutes 15   T5 Nustep   Level 2   Watts 30   Minutes 15   Biostep-RELP   Level 2   Watts 30   Minutes 15   Prescription Details   Frequency (times per week) 3   Duration Progress to 45 minutes of aerobic exercise without signs/symptoms of physical distress   Intensity   THRR REST +  30   Ratings of Perceived Exertion 11-15   Progression   Progression Continue to progress workloads to maintain intensity without signs/symptoms of physical distress.   Resistance Training   Training Prescription Yes   Weight 3   Reps 10-15      Perform Capillary Blood Glucose checks as needed.  Exercise Prescription Changes:     Exercise Prescription Changes      11/17/15 1600           Exercise Review   Progression Yes       Response to Exercise   Symptoms None       Comments Reviewed individualized exercise prescription and made increases per departmental policy.  Exercise increases were discussed with the patient and they were able to perform the new work loads without issue (no signs or symptoms).        Duration Progress to 30 minutes of continuous aerobic without signs/symptoms of physical distress       Intensity Rest + 30       Progression   Progression Continue progressive overload as per policy without signs/symptoms or physical distress.       Resistance Training   Training Prescription Yes       Weight 3       Reps 10-15       Treadmill   MPH 2       Grade 0       Minutes 15       Bike   Level 1       Minutes 15       Recumbant Bike   Level 2       RPM 15       Minutes 15       NuStep   Level 2       Watts 30       Minutes 15       Recumbant Elliptical   Level 2       RPM 30       Minutes 15       REL-XR   Level 2       Watts 40       Minutes 15       T5 Nustep   Level 2       Watts 30       Minutes 15       Biostep-RELP   Level 3       Watts 25       Minutes 15          Exercise Comments:   Discharge Exercise Prescription (Final Exercise Prescription Changes):     Exercise Prescription Changes - 11/17/15 1600    Exercise Review   Progression Yes   Response to Exercise   Symptoms None   Comments Reviewed individualized exercise prescription and made increases per departmental policy. Exercise increases were discussed with the patient and they were able to perform the new work loads without issue (no signs or symptoms).    Duration Progress to  30 minutes of continuous aerobic without signs/symptoms of physical distress   Intensity Rest + 30   Progression   Progression Continue progressive overload as per policy without signs/symptoms or physical distress.   Resistance Training   Training Prescription Yes   Weight 3   Reps 10-15   Treadmill   MPH 2   Grade 0   Minutes 15   Bike   Level 1   Minutes 15   Recumbant Bike   Level 2   RPM 15   Minutes 15   NuStep   Level 2   Watts 30   Minutes 15    Recumbant Elliptical   Level 2   RPM 30   Minutes 15   REL-XR   Level 2   Watts 40   Minutes 15   T5 Nustep   Level 2   Watts 30   Minutes 15   Biostep-RELP   Level 3   Watts 25   Minutes 15      Nutrition:  Target Goals: Understanding of nutrition guidelines, daily intake of sodium 1500mg , cholesterol 200mg , calories 30% from fat and 7% or less from saturated fats, daily to have 5 or more servings of fruits and vegetables.  Biometrics:     Pre Biometrics - 11/10/15 1640    Pre Biometrics   Height 5\' 7"  (1.702 m)   Weight 227 lb (102.967 kg)   Waist Circumference 47 inches   Hip Circumference 48.25 inches   Waist to Hip Ratio 0.97 %   BMI (Calculated) 35.6       Nutrition Therapy Plan and Nutrition Goals:     Nutrition Therapy & Goals - 11/10/15 1300    Intervention Plan   Intervention Prescribe, educate and counsel regarding individualized specific dietary modifications aiming towards targeted core components such as weight, hypertension, lipid management, diabetes, heart failure and other comorbidities.;Nutrition handout(s) given to patient.   Expected Outcomes Short Term Goal: Understand basic principles of dietary content, such as calories, fat, sodium, cholesterol and nutrients.;Short Term Goal: A plan has been developed with personal nutrition goals set during dietitian appointment.;Long Term Goal: Adherence to prescribed nutrition plan.      Nutrition Discharge: Rate Your Plate Scores:   Nutrition Goals Re-Evaluation:   Psychosocial: Target Goals: Acknowledge presence or absence of depression, maximize coping skills, provide positive support system. Participant is able to verbalize types and ability to use techniques and skills needed for reducing stress and depression.  Initial Review & Psychosocial Screening:     Initial Psych Review & Screening - 11/11/15 0952    Initial Review   Current issues with Current Stress Concerns   Source of  Stress Concerns Transportation;Chronic Illness   Family Dynamics   Good Support System? Yes   Screening Interventions   Interventions Encouraged to exercise      Quality of Life Scores:     Quality of Life - 11/10/15 1511    Quality of Life Scores   Health/Function Pre 19.87 %   Socioeconomic Pre 22.93 %   Psych/Spiritual Pre 23.86 %   Family Pre 30 %   GLOBAL Pre 22.81 %      PHQ-9:     Recent Review Flowsheet Data    Depression screen [facility] 2/9 11/10/2015   Decreased Interest 0   Down, Depressed, Hopeless 0   PHQ - 2 Score 0   Altered sleeping 0   Tired, decreased energy 3   Change in appetite 0   Feeling  bad or failure about yourself  0   Trouble concentrating 0   Moving slowly or fidgety/restless 0   Suicidal thoughts 0   PHQ-9 Score 3   Difficult doing work/chores Somewhat difficult      Psychosocial Evaluation and Intervention:   Psychosocial Re-Evaluation:   Vocational Rehabilitation: Provide vocational rehab assistance to qualifying candidates.   Vocational Rehab Evaluation & Intervention:   Education: Education Goals: Education classes will be provided on a weekly basis, covering required topics. Participant will state understanding/return demonstration of topics presented.  Learning Barriers/Preferences:     Learning Barriers/Preferences - 11/10/15 1323    Learning Barriers/Preferences   Learning Barriers None   Learning Preferences None      Education Topics: General Nutrition Guidelines/Fats and Fiber: -Group instruction provided by verbal, written material, models and posters to present the general guidelines for heart healthy nutrition. Gives an explanation and review of dietary fats and fiber.   Controlling Sodium/Reading Food Labels: -Group verbal and written material supporting the discussion of sodium use in heart healthy nutrition. Review and explanation with models, verbal and written materials for utilization of the food  label.   Exercise Physiology & Risk Factors: - Group verbal and written instruction with models to review the exercise physiology of the cardiovascular system and associated critical values. Details cardiovascular disease risk factors and the goals associated with each risk factor.   Aerobic Exercise & Resistance Training: - Gives group verbal and written discussion on the health impact of inactivity. On the components of aerobic and resistive training programs and the benefits of this training and how to safely progress through these programs.   Flexibility, Balance, General Exercise Guidelines: - Provides group verbal and written instruction on the benefits of flexibility and balance training programs. Provides general exercise guidelines with specific guidelines to those with heart or lung disease. Demonstration and skill practice provided.   Stress Management: - Provides group verbal and written instruction about the health risks of elevated stress, cause of high stress, and healthy ways to reduce stress.   Depression: - Provides group verbal and written instruction on the correlation between heart/lung disease and depressed mood, treatment options, and the stigmas associated with seeking treatment.   Anatomy & Physiology of the Heart: - Group verbal and written instruction and models provide basic cardiac anatomy and physiology, with the coronary electrical and arterial systems. Review of: AMI, Angina, Valve disease, Heart Failure, Cardiac Arrhythmia, Pacemakers, and the ICD.   Cardiac Procedures: - Group verbal and written instruction and models to describe the testing methods done to diagnose heart disease. Reviews the outcomes of the test results. Describes the treatment choices: Medical Management, Angioplasty, or Coronary Bypass Surgery.          Cardiac Rehab from 11/17/2015 in [facility] Cardiac and Pulmonary Rehab   Date  11/15/15   Educator  CE   Instruction Review Code  2-  meets goals/outcomes      Cardiac Medications: - Group verbal and written instruction to review commonly prescribed medications for heart disease. Reviews the medication, class of the drug, and side effects. Includes the steps to properly store meds and maintain the prescription regimen.      Cardiac Rehab from 11/17/2015 in [facility] Cardiac [facility] Pulmonary Rehab   Date  11/17/15   Educator  DW   Instruction Review Code  2- meets goals/outcomes      Go Sex-Intimacy & Heart Disease, Get SMART - Goal Setting: - Group verbal and written instruction through  game format to discuss heart disease and the return to sexual intimacy. Provides group verbal and written material to discuss and apply goal setting through the application of the S.M.A.R.T. Method.      Cardiac Rehab from 11/17/2015 in [facility] Cardiac and Pulmonary Rehab   Date  11/15/15   Educator  CE   Instruction Review Code  2- meets goals/outcomes      Other Matters of the Heart: - Provides group verbal, written materials and models to describe Heart Failure, Angina, Valve Disease, and Diabetes in the realm of heart disease. Includes description of the disease process and treatment options available to the cardiac patient.   Exercise & Equipment Safety: - Individual verbal instruction and demonstration of equipment use and safety with use of the equipment.      Cardiac Rehab from 11/17/2015 in [facility] Cardiac and Pulmonary Rehab   Date  11/10/15   Educator  Sb   Instruction Review Code  2- meets goals/outcomes      Infection Prevention: - Provides verbal and written material to individual with discussion of infection control including proper hand washing and proper equipment cleaning during exercise session.      Cardiac Rehab from 11/17/2015 in [facility] Cardiac and Pulmonary Rehab   Date  11/10/15   Educator  Sb   Instruction Review Code  2- meets goals/outcomes      Falls Prevention: - Provides verbal and written material to  individual with discussion of falls prevention and safety.      Cardiac Rehab from 11/17/2015 in [facility] Cardiac and Pulmonary Rehab   Date  11/10/15   Educator  SB   Instruction Review Code  2- meets goals/outcomes      Diabetes: - Individual verbal and written instruction to review signs/symptoms of diabetes, desired ranges of glucose level fasting, after meals and with exercise. Advice that pre and post exercise glucose checks will be done for 3 sessions at entry of program.    Knowledge Questionnaire Score:     Knowledge Questionnaire Score - 11/10/15 1323    Knowledge Questionnaire Score   Pre Score 24/28      Personal Goals and Risk Factors at Admission:     Personal Goals and Risk Factors at Admission - 11/11/15 0951    Core Components/Risk Factors/Patient Goals on Admission    Weight Management Yes;Obesity   Intervention Weight Management: Develop a combined nutrition and exercise program designed to reach desired caloric intake, while maintaining appropriate intake of nutrient and fiber, sodium and fats, and appropriate energy expenditure required for the weight goal.;Weight Management: Provide education and appropriate resources to help participant work on and attain dietary goals.;Weight Management/Obesity: Establish reasonable short term and long term weight goals.;Obesity: Provide education and appropriate resources to help participant work on and attain dietary goals.   Admit Weight 220 lb (99.791 kg)   Goal Weight: Short Term 216 lb (97.977 kg)   Goal Weight: Long Term 174 lb (78.926 kg)   Expected Outcomes Short Term: Continue to assess and modify interventions until short term weight is achieved.;Long Term: Adherence to nutrition and physical activity/exercise program aimed toward attainment of established weight goal.   Sedentary Yes   Intervention Provide advice, education, support and counseling about physical activity/exercise needs.;Develop an individualized  exercise prescription for aerobic and resistive training based on initial evaluation findings, risk stratification, comorbidities and participant's personal goals.   Expected Outcomes Achievement of increased cardiorespiratory fitness and enhanced flexibility, muscular endurance and strength shown through measurements  of functional capaciy and personal statement of participant.   Hypertension Yes   Intervention Provide education on lifestyle modifcations including regular physical activity/exercise, weight management, moderate sodium restriction and increased consumption of fresh fruit, vegetables, and low fat dairy, alcohol moderation, and smoking cessation.;Monitor prescription use compliance.   Expected Outcomes Short Term: Continued assessment and intervention until BP is < 140/50mm HG in hypertensive participants. < 130/89mm HG in hypertensive participants with diabetes, heart failure or chronic kidney disease.;Long Term: Maintenance of blood pressure at goal levels.   Lipids Yes   Intervention Provide education and support for participant on nutrition & aerobic/resistive exercise along with prescribed medications to achieve LDL 70mg , HDL >40mg .   Expected Outcomes Short Term: Participant states understanding of desired cholesterol values and is compliant with medications prescribed. Participant is following exercise prescription and nutrition guidelines.;Long Term: Cholesterol controlled with medications as prescribed, with individualized exercise RX and with personalized nutrition plan. Value goals: LDL < 70mg , HDL > 40 mg.   Personal Goal Other Yes   Personal Goal Improve my quality of life   Intervention Provide nutrition and exercise guidelines as with other goals   Expected Outcomes Increase in quality of life as verbalized by more energy to go home and spend time with my family.      Personal Goals and Risk Factors Review:    Personal Goals Discharge (Final Personal Goals and Risk  Factors Review):    ITP Comments:     ITP Comments      11/10/15 1249 11/18/15 1516         ITP Comments Medical review completed   Initial ITP continue with ITP 30 Day Review. Continue with the ITP.  New to program.          Comments:

## 2015-11-29 ENCOUNTER — Encounter: Payer: 59 | Attending: Internal Medicine

## 2015-11-29 DIAGNOSIS — I5042 Chronic combined systolic (congestive) and diastolic (congestive) heart failure: Secondary | ICD-10-CM | POA: Insufficient documentation

## 2015-12-03 ENCOUNTER — Encounter: Payer: Self-pay | Admitting: Dietician

## 2015-12-08 ENCOUNTER — Encounter: Payer: 59 | Admitting: *Deleted

## 2015-12-08 DIAGNOSIS — I5042 Chronic combined systolic (congestive) and diastolic (congestive) heart failure: Secondary | ICD-10-CM

## 2015-12-08 NOTE — Progress Notes (Signed)
Daily Session Note  Patient Details  Name: Henry Moreno MRN: [id number] Date of Birth: 10/23/1950 Referring Provider:  [name], MD  Encounter Date: 12/08/2015  Check In:     Session Check In - 12/08/15 1630    Check-In   Location [facility]-Cardiac & Pulmonary Rehab   Staff Present Nyoka Cowden, RN;Carroll Enterkin, RN, Jana Half, RN, BSN   Supervising physician immediately available to respond to emergencies See telemetry face sheet for immediately available ER MD   Medication changes reported     No   Fall or balance concerns reported    No   Warm-up and Cool-down Performed on first and last piece of equipment   Resistance Training Performed Yes   VAD Patient? No   Pain Assessment   Currently in Pain? No/denies         Goals Met:  Independence with exercise equipment Exercise tolerated well No report of cardiac concerns or symptoms Strength training completed today  Goals Unmet:  Not Applicable  Comments:  Patient completed exercise prescription and all exercise goals during rehab session. The exercise was tolerated well and the patient is progressing in the program.    Dr. Emily Filbert is Medical Director for [place] and [facility].

## 2015-12-09 ENCOUNTER — Encounter: Payer: 59 | Admitting: *Deleted

## 2015-12-09 DIAGNOSIS — I5042 Chronic combined systolic (congestive) and diastolic (congestive) heart failure: Secondary | ICD-10-CM

## 2015-12-09 NOTE — Progress Notes (Signed)
Daily Session Note  Patient Details  Name: Henry Moreno MRN: [id number] Date of Birth: 1950-11-06 Referring Provider:    Encounter Date: 12/09/2015  Check In:     Session Check In - 12/09/15 1648    Check-In   Location [facility]-Cardiac & Pulmonary Rehab   Staff Present Nyoka Cowden, RN;Carroll Enterkin, RN, Jana Half, RN, BSN   Supervising physician immediately available to respond to emergencies See telemetry face sheet for immediately available ER MD   Medication changes reported     No   Fall or balance concerns reported    No   Warm-up and Cool-down Performed on first and last piece of equipment   Resistance Training Performed Yes   VAD Patient? No   Pain Assessment   Currently in Pain? No/denies           Exercise Prescription Changes - 12/09/15 1600    Exercise Review   Progression Yes   Response to Exercise   Symptoms None   Comments Reviewed individualized exercise prescription and made increases per departmental policy. Exercise increases were discussed with the patient and they were able to perform the new work loads without issue (no signs or symptoms).    Duration Progress to 30 minutes of continuous aerobic without signs/symptoms of physical distress   Intensity Rest + 30   Progression   Progression Continue progressive overload as per policy without signs/symptoms or physical distress.   Resistance Training   Training Prescription Yes   Weight 3   Reps 10-15   Treadmill   MPH 2   Grade 0   Minutes 15   Bike   Level 1   Minutes 15   Recumbant Bike   Level 2   RPM 15   Minutes 15   NuStep   Level 2   Watts 30   Minutes 15   Recumbant Elliptical   Level 2   RPM 30   Minutes 15   REL-XR   Level 2   Watts 40   Minutes 15   T5 Nustep   Level 4   Watts 25   Minutes 15   Biostep-RELP   Level 3   Watts 25   Minutes 15      Goals Met:  Independence with exercise equipment Exercise tolerated well Personal goals reviewed No  report of cardiac concerns or symptoms  Goals Unmet:  Not Applicable  Comments:  Patient completed exercise prescription and all exercise goals during rehab session. The exercise was tolerated well and the patient is progressing in the program.    Dr. Emily Filbert is Medical Director for [place] and [facility].

## 2015-12-13 DIAGNOSIS — I5042 Chronic combined systolic (congestive) and diastolic (congestive) heart failure: Secondary | ICD-10-CM | POA: Diagnosis not present

## 2015-12-13 NOTE — Progress Notes (Signed)
Daily Session Note  Patient Details  Name: Henry Moreno MRN: [id number] Date of Birth: [birth year] Referring Provider:    Encounter Date: 12/13/2015  Check In:     Session Check In - 12/13/15 1612    Check-In   Location [facility]-Cardiac & Pulmonary Rehab   Staff Present Nyoka Cowden, RN;Susanne Bice, RN, BSN, CCRP;Tila Millirons Brayton El, DPT, CEEA   Supervising physician immediately available to respond to emergencies See telemetry face sheet for immediately available ER MD   Medication changes reported     No   Fall or balance concerns reported    No   Warm-up and Cool-down Performed on first and last piece of equipment   Resistance Training Performed Yes   VAD Patient? No         Goals Met:  Exercise tolerated well No report of cardiac concerns or symptoms Strength training completed today  Goals Unmet:  Not Applicable  Comments: Patient completed exercise prescription and all exercise goals during rehab session. The exercise was tolerated well and the patient is progressing in the program.    Dr. Emily Filbert is Medical Director for [place] and [facility].

## 2015-12-19 ENCOUNTER — Encounter: Payer: Self-pay | Admitting: *Deleted

## 2015-12-19 DIAGNOSIS — I5042 Chronic combined systolic (congestive) and diastolic (congestive) heart failure: Secondary | ICD-10-CM

## 2015-12-19 NOTE — Progress Notes (Signed)
Cardiac Individual Treatment Plan  Patient Details  Name: Henry Moreno MRN: [id number] Date of Birth: 10-18-1950 Referring Provider:    Initial Encounter Date:       Cardiac Rehab from 11/10/2015 in [facility] Cardiac and Pulmonary Rehab   Date  11/10/15      Visit Diagnosis: Heart failure, systolic and diastolic, chronic ([place])  Patient's Home Medications on Admission:  Current outpatient prescriptions:  .  acetaminophen (TYLENOL) 500 MG tablet, Take 1,000 mg by mouth 2 (two) times daily., Disp: , Rfl:  .  apixaban (ELIQUIS) 5 MG TABS tablet, Take by mouth., Disp: , Rfl:  .  carvedilol (COREG) 25 MG tablet, , Disp: , Rfl:  .  cetirizine (ZYRTEC) 10 MG tablet, Take by mouth., Disp: , Rfl:  .  Cholecalciferol (VITAMIN D3) 3000 units TABS, Take 1 capsule by mouth 2 (two) times daily. Taking a  2090m dose, Disp: , Rfl:  .  Magnesium 200 MG TABS, Take by mouth., Disp: , Rfl:  .  Multiple Vitamin (MULTI VITAMIN DAILY) TABS, Take by mouth., Disp: , Rfl:  .  valsartan (DIOVAN) 80 MG tablet, , Disp: , Rfl:   Past Medical History: No past medical history on file.  Tobacco Use: History  Smoking status  . Not on file  Smokeless tobacco  . Not on file    Labs: Recent Review Flowsheet Data    There is no flowsheet data to display.       Exercise Target Goals:    Exercise Program Goal: Individual exercise prescription set with THRR, safety & activity barriers. Participant demonstrates ability to understand and report RPE using BORG scale, to self-measure pulse accurately, and to acknowledge the importance of the exercise prescription.  Exercise Prescription Goal: Starting with aerobic activity 30 plus minutes a day, 3 days per week for initial exercise prescription. Provide home exercise prescription and guidelines that participant acknowledges understanding prior to discharge.  Activity Barriers & Risk Stratification:     Activity Barriers & Cardiac Risk Stratification -  11/10/15 1321    Activity Barriers & Cardiac Risk Stratification   Activity Barriers Joint Problems;Deconditioning  Right shoulder/arm injury being followed by Dr KCaryl Comes   Still needs full evaluation. ICD  and other heart issues have taken precedent over this arm issue.   Cardiac Risk Stratification High      6 Minute Walk:     6 Minute Walk      11/10/15 1636 11/11/15 0950     6 Minute Walk   Phase Initial     Distance 1200 feet 1200 feet    Walk Time 6 minutes 6 minutes    MPH 2.3 2.3    RPE 12 12    Symptoms No No    Resting HR 78 bpm 78 bpm    Max Ex. HR 116 bpm 116 bpm       Initial Exercise Prescription:     Initial Exercise Prescription - 11/10/15 1600    Date of Initial Exercise RX and Referring Provider   Date 11/10/15   Treadmill   MPH 2   Grade 0   Minutes 15   Bike   Level 1   Minutes 15   Recumbant Bike   Level 2   RPM 15   Minutes 15   NuStep   Level 2   Watts 30   Minutes 15   Recumbant Elliptical   Level 2   RPM 30   Minutes 15   REL-XR  Level 2   Watts 40   Minutes 15   T5 Nustep   Level 2   Watts 30   Minutes 15   Biostep-RELP   Level 2   Watts 30   Minutes 15   Prescription Details   Frequency (times per week) 3   Duration Progress to 45 minutes of aerobic exercise without signs/symptoms of physical distress   Intensity   THRR REST +  30   Ratings of Perceived Exertion 11-15   Progression   Progression Continue to progress workloads to maintain intensity without signs/symptoms of physical distress.   Resistance Training   Training Prescription Yes   Weight 3   Reps 10-15      Perform Capillary Blood Glucose checks as needed.  Exercise Prescription Changes:     Exercise Prescription Changes      11/17/15 1600 12/09/15 1600 12/13/15 1600       Exercise Review   Progression Yes Yes Yes     Response to Exercise   Blood Pressure (Admit)   134/80 mmHg     Blood Pressure (Exercise)   142/78 mmHg     Blood  Pressure (Exit)   128/78 mmHg     Heart Rate (Admit)   63 bpm     Heart Rate (Exercise)   89 bpm     Heart Rate (Exit)   66 bpm     Rating of Perceived Exertion (Exercise)   13     Symptoms None None None     Comments Reviewed individualized exercise prescription and made increases per departmental policy. Exercise increases were discussed with the patient and they were able to perform the new work loads without issue (no signs or symptoms).  Reviewed individualized exercise prescription and made increases per departmental policy. Exercise increases were discussed with the patient and they were able to perform the new work loads without issue (no signs or symptoms).  Reviewed individualized exercise prescription and made increases per departmental policy. Exercise increases were discussed with the patient and they were able to perform the new work loads without issue (no signs or symptoms).      Duration Progress to 30 minutes of continuous aerobic without signs/symptoms of physical distress Progress to 30 minutes of continuous aerobic without signs/symptoms of physical distress Progress to 30 minutes of continuous aerobic without signs/symptoms of physical distress     Intensity Rest + 30 Rest + 30 Rest + 30     Progression   Progression Continue progressive overload as per policy without signs/symptoms or physical distress. Continue progressive overload as per policy without signs/symptoms or physical distress. Continue progressive overload as per policy without signs/symptoms or physical distress.     Resistance Training   Training Prescription Yes Yes Yes     Weight 3 3 3      Reps 10-15 10-15 10-15     Interval Training   Interval Training   No     Treadmill   MPH 2 2 2      Grade 0 0 0     Minutes 15 15 15      Bike   Level 1 1 1      Minutes 15 15 15      Recumbant Bike   Level 2 2 2      RPM 15 15 15      Minutes 15 15 15      NuStep   Level 2 2 2      Watts 30 30 30  Minutes 15 15  15      Recumbant Elliptical   Level 2 2 2      RPM 30 30 30      Minutes 15 15 15      REL-XR   Level 2 2 2      Watts 40 40 40     Minutes 15 15 15      T5 Nustep   Level 2 4 4      Watts 30 25 25      Minutes 15 15 15      Biostep-RELP   Level 3 3 3      Watts 25 25 25      Minutes 15 15 15         Exercise Comments:     Exercise Comments      12/17/15 1239           Exercise Comments Merry Proud has attended [facility] Track sporadically but has made increases in exercise duration and intensity.  Goal will be to overcome barriers to more regular attendance          Discharge Exercise Prescription (Final Exercise Prescription Changes):     Exercise Prescription Changes - 12/13/15 1600    Exercise Review   Progression Yes   Response to Exercise   Blood Pressure (Admit) 134/80 mmHg   Blood Pressure (Exercise) 142/78 mmHg   Blood Pressure (Exit) 128/78 mmHg   Heart Rate (Admit) 63 bpm   Heart Rate (Exercise) 89 bpm   Heart Rate (Exit) 66 bpm   Rating of Perceived Exertion (Exercise) 13   Symptoms None   Comments Reviewed individualized exercise prescription and made increases per departmental policy. Exercise increases were discussed with the patient and they were able to perform the new work loads without issue (no signs or symptoms).    Duration Progress to 30 minutes of continuous aerobic without signs/symptoms of physical distress   Intensity Rest + 30   Progression   Progression Continue progressive overload as per policy without signs/symptoms or physical distress.   Resistance Training   Training Prescription Yes   Weight 3   Reps 10-15   Interval Training   Interval Training No   Treadmill   MPH 2   Grade 0   Minutes 15   Bike   Level 1   Minutes 15   Recumbant Bike   Level 2   RPM 15   Minutes 15   NuStep   Level 2   Watts 30   Minutes 15   Recumbant Elliptical   Level 2   RPM 30   Minutes 15   REL-XR   Level 2   Watts 40   Minutes 15   T5 Nustep    Level 4   Watts 25   Minutes 15   Biostep-RELP   Level 3   Watts 25   Minutes 15      Nutrition:  Target Goals: Understanding of nutrition guidelines, daily intake of sodium 1500mg , cholesterol 200mg , calories 30% from fat and 7% or less from saturated fats, daily to have 5 or more servings of fruits and vegetables.  Biometrics:     Pre Biometrics - 11/10/15 1640    Pre Biometrics   Height 5\' 7"  (1.702 m)   Weight 227 lb (102.967 kg)   Waist Circumference 47 inches   Hip Circumference 48.25 inches   Waist to Hip Ratio 0.97 %   BMI (Calculated) 35.6       Nutrition Therapy Plan and Nutrition Goals:  Nutrition Therapy & Goals - 11/10/15 1300    Intervention Plan   Intervention Prescribe, educate and counsel regarding individualized specific dietary modifications aiming towards targeted core components such as weight, hypertension, lipid management, diabetes, heart failure and other comorbidities.;Nutrition handout(s) given to patient.   Expected Outcomes Short Term Goal: Understand basic principles of dietary content, such as calories, fat, sodium, cholesterol and nutrients.;Short Term Goal: A plan has been developed with personal nutrition goals set during dietitian appointment.;Long Term Goal: Adherence to prescribed nutrition plan.      Nutrition Discharge: Rate Your Plate Scores:     Nutrition Assessments - 12/03/15 1317    Rate Your Plate Scores   Pre Score 68   Pre Score % 76 %      Nutrition Goals Re-Evaluation:   Psychosocial: Target Goals: Acknowledge presence or absence of depression, maximize coping skills, provide positive support system. Participant is able to verbalize types and ability to use techniques and skills needed for reducing stress and depression.  Initial Review & Psychosocial Screening:     Initial Psych Review & Screening - 11/11/15 0952    Initial Review   Current issues with Current Stress Concerns   Source of Stress Concerns  Transportation;Chronic Illness   Family Dynamics   Good Support System? Yes   Screening Interventions   Interventions Encouraged to exercise      Quality of Life Scores:     Quality of Life - 11/10/15 1511    Quality of Life Scores   Health/Function Pre 19.87 %   Socioeconomic Pre 22.93 %   Psych/Spiritual Pre 23.86 %   Family Pre 30 %   GLOBAL Pre 22.81 %      PHQ-9:     Recent Review Flowsheet Data    Depression screen [facility] 2/9 11/10/2015   Decreased Interest 0   Down, Depressed, Hopeless 0   PHQ - 2 Score 0   Altered sleeping 0   Tired, decreased energy 3   Change in appetite 0   Feeling bad or failure about yourself  0   Trouble concentrating 0   Moving slowly or fidgety/restless 0   Suicidal thoughts 0   PHQ-9 Score 3   Difficult doing work/chores Somewhat difficult      Psychosocial Evaluation and Intervention:   Psychosocial Re-Evaluation:   Vocational Rehabilitation: Provide vocational rehab assistance to qualifying candidates.   Vocational Rehab Evaluation & Intervention:   Education: Education Goals: Education classes will be provided on a weekly basis, covering required topics. Participant will state understanding/return demonstration of topics presented.  Learning Barriers/Preferences:     Learning Barriers/Preferences - 11/10/15 1323    Learning Barriers/Preferences   Learning Barriers None   Learning Preferences None      Education Topics: General Nutrition Guidelines/Fats and Fiber: -Group instruction provided by verbal, written material, models and posters to present the general guidelines for heart healthy nutrition. Gives an explanation and review of dietary fats and fiber.   Controlling Sodium/Reading Food Labels: -Group verbal and written material supporting the discussion of sodium use in heart healthy nutrition. Review and explanation with models, verbal and written materials for utilization of the food label.   Exercise  Physiology & Risk Factors: - Group verbal and written instruction with models to review the exercise physiology of the cardiovascular system and associated critical values. Details cardiovascular disease risk factors and the goals associated with each risk factor.   Aerobic Exercise & Resistance Training: - Gives group verbal and written discussion  on the health impact of inactivity. On the components of aerobic and resistive training programs and the benefits of this training and how to safely progress through these programs.   Flexibility, Balance, General Exercise Guidelines: - Provides group verbal and written instruction on the benefits of flexibility and balance training programs. Provides general exercise guidelines with specific guidelines to those with heart or lung disease. Demonstration and skill practice provided.   Stress Management: - Provides group verbal and written instruction about the health risks of elevated stress, cause of high stress, and healthy ways to reduce stress.   Depression: - Provides group verbal and written instruction on the correlation between heart/lung disease and depressed mood, treatment options, and the stigmas associated with seeking treatment.   Anatomy & Physiology of the Heart: - Group verbal and written instruction and models provide basic cardiac anatomy and physiology, with the coronary electrical and arterial systems. Review of: AMI, Angina, Valve disease, Heart Failure, Cardiac Arrhythmia, Pacemakers, and the ICD.   Cardiac Procedures: - Group verbal and written instruction and models to describe the testing methods done to diagnose heart disease. Reviews the outcomes of the test results. Describes the treatment choices: Medical Management, Angioplasty, or Coronary Bypass Surgery.          Cardiac Rehab from 12/13/2015 in [facility] Cardiac and Pulmonary Rehab   Date  11/15/15   Educator  CE   Instruction Review Code  2- meets goals/outcomes       Cardiac Medications: - Group verbal and written instruction to review commonly prescribed medications for heart disease. Reviews the medication, class of the drug, and side effects. Includes the steps to properly store meds and maintain the prescription regimen.      Cardiac Rehab from 12/13/2015 in [facility] Cardiac and Pulmonary Rehab   Date  11/17/15   Educator  DW   Instruction Review Code  2- meets goals/outcomes      Go Sex-Intimacy & Heart Disease, Get SMART - Goal Setting: - Group verbal and written instruction through game format to discuss heart disease and the return to sexual intimacy. Provides group verbal and written material to discuss and apply goal setting through the application of the S.M.A.R.T. Method.      Cardiac Rehab from 12/13/2015 in [facility] Cardiac and Pulmonary Rehab   Date  11/15/15   Educator  CE   Instruction Review Code  2- meets goals/outcomes      Other Matters of the Heart: - Provides group verbal, written materials and models to describe Heart Failure, Angina, Valve Disease, and Diabetes in the realm of heart disease. Includes description of the disease process and treatment options available to the cardiac patient.      Cardiac Rehab from 12/13/2015 in [facility] Cardiac and Pulmonary Rehab   Date  12/13/15   Educator  SB   Instruction Review Code  2- meets goals/outcomes      Exercise & Equipment Safety: - Individual verbal instruction and demonstration of equipment use and safety with use of the equipment.      Cardiac Rehab from 12/13/2015 in [facility] Cardiac and Pulmonary Rehab   Date  11/10/15   Educator  Sb   Instruction Review Code  2- meets goals/outcomes      Infection Prevention: - Provides verbal and written material to individual with discussion of infection control including proper hand washing and proper equipment cleaning during exercise session.      Cardiac Rehab from 12/13/2015 in [facility] Cardiac and Pulmonary Rehab  Date  11/10/15    Educator  Sb   Instruction Review Code  2- meets goals/outcomes      Falls Prevention: - Provides verbal and written material to individual with discussion of falls prevention and safety.      Cardiac Rehab from 12/13/2015 in [facility] Cardiac and Pulmonary Rehab   Date  11/10/15   Educator  SB   Instruction Review Code  2- meets goals/outcomes      Diabetes: - Individual verbal and written instruction to review signs/symptoms of diabetes, desired ranges of glucose level fasting, after meals and with exercise. Advice that pre and post exercise glucose checks will be done for 3 sessions at entry of program.    Knowledge Questionnaire Score:     Knowledge Questionnaire Score - 11/10/15 1323    Knowledge Questionnaire Score   Pre Score 24/28      Core Components/Risk Factors/Patient Goals at Admission:     Personal Goals and Risk Factors at Admission - 11/11/15 0951    Core Components/Risk Factors/Patient Goals on Admission    Weight Management Yes;Obesity   Intervention Weight Management: Develop a combined nutrition and exercise program designed to reach desired caloric intake, while maintaining appropriate intake of nutrient and fiber, sodium and fats, and appropriate energy expenditure required for the weight goal.;Weight Management: Provide education and appropriate resources to help participant work on and attain dietary goals.;Weight Management/Obesity: Establish reasonable short term and long term weight goals.;Obesity: Provide education and appropriate resources to help participant work on and attain dietary goals.   Admit Weight 220 lb (99.791 kg)   Goal Weight: Short Term 216 lb (97.977 kg)   Goal Weight: Long Term 174 lb (78.926 kg)   Expected Outcomes Short Term: Continue to assess and modify interventions until short term weight is achieved.;Long Term: Adherence to nutrition and physical activity/exercise program aimed toward attainment of established weight goal.    Sedentary Yes   Intervention Provide advice, education, support and counseling about physical activity/exercise needs.;Develop an individualized exercise prescription for aerobic and resistive training based on initial evaluation findings, risk stratification, comorbidities and participant's personal goals.   Expected Outcomes Achievement of increased cardiorespiratory fitness and enhanced flexibility, muscular endurance and strength shown through measurements of functional capaciy and personal statement of participant.   Hypertension Yes   Intervention Provide education on lifestyle modifcations including regular physical activity/exercise, weight management, moderate sodium restriction and increased consumption of fresh fruit, vegetables, and low fat dairy, alcohol moderation, and smoking cessation.;Monitor prescription use compliance.   Expected Outcomes Short Term: Continued assessment and intervention until BP is < 140/90mm HG in hypertensive participants. < 130/28mm HG in hypertensive participants with diabetes, heart failure or chronic kidney disease.;Long Term: Maintenance of blood pressure at goal levels.   Lipids Yes   Intervention Provide education and support for participant on nutrition & aerobic/resistive exercise along with prescribed medications to achieve LDL 70mg , HDL >40mg .   Expected Outcomes Short Term: Participant states understanding of desired cholesterol values and is compliant with medications prescribed. Participant is following exercise prescription and nutrition guidelines.;Long Term: Cholesterol controlled with medications as prescribed, with individualized exercise RX and with personalized nutrition plan. Value goals: LDL < 70mg , HDL > 40 mg.   Personal Goal Other Yes   Personal Goal Improve my quality of life   Intervention Provide nutrition and exercise guidelines as with other goals   Expected Outcomes Increase in quality of life as verbalized by more energy to go home  and spend time  with my family.      Core Components/Risk Factors/Patient Goals Review:      Goals and Risk Factor Review      12/08/15 1722           Core Components/Risk Factors/Patient Goals Review   Personal Goals Review Weight Management/Obesity       Review I made a dietician appt for [place]. He has committed to writing down what he eats or putting it on his phone program. Merry Proud will also track his steps. He will try to walk 10 minutes at work since he says exercise in the past has helped him lose weight.        Expected Outcomes Lose weight and eat healthier.           Core Components/Risk Factors/Patient Goals at Discharge (Final Review):      Goals and Risk Factor Review - 12/08/15 1722    Core Components/Risk Factors/Patient Goals Review   Personal Goals Review Weight Management/Obesity   Review I made a dietician appt for [place]. He has committed to writing down what he eats or putting it on his phone program. Merry Proud will also track his steps. He will try to walk 10 minutes at work since he says exercise in the past has helped him lose weight.    Expected Outcomes Lose weight and eat healthier.       ITP Comments:     ITP Comments      11/10/15 1249 11/18/15 1516 12/08/15 1720 12/19/15 1026 12/19/15 1027   ITP Comments Medical review completed   Initial ITP continue with ITP 30 Day Review. Continue with the ITP.  New to program.  I made a dietician appt for Merry Proud. He has committed to writing down what he eats or putting it on his phone program. Merry Proud will also track his steps. He will try to walk 10 minutes at work since he says exercise in the past has helped him lose weight.  30 Day review. Continue with ITP 3 visits all mid April      Comments:

## 2015-12-27 ENCOUNTER — Encounter: Payer: 59 | Attending: Internal Medicine

## 2015-12-27 DIAGNOSIS — I5042 Chronic combined systolic (congestive) and diastolic (congestive) heart failure: Secondary | ICD-10-CM | POA: Insufficient documentation

## 2015-12-29 DIAGNOSIS — M7581 Other shoulder lesions, right shoulder: Secondary | ICD-10-CM | POA: Insufficient documentation

## 2015-12-29 DIAGNOSIS — S46101A Unspecified injury of muscle, fascia and tendon of long head of biceps, right arm, initial encounter: Secondary | ICD-10-CM | POA: Insufficient documentation

## 2015-12-29 DIAGNOSIS — M75121 Complete rotator cuff tear or rupture of right shoulder, not specified as traumatic: Secondary | ICD-10-CM | POA: Insufficient documentation

## 2016-01-03 ENCOUNTER — Other Ambulatory Visit: Payer: Self-pay | Admitting: Surgery

## 2016-01-03 DIAGNOSIS — I5042 Chronic combined systolic (congestive) and diastolic (congestive) heart failure: Secondary | ICD-10-CM | POA: Diagnosis present

## 2016-01-03 DIAGNOSIS — M25511 Pain in right shoulder: Secondary | ICD-10-CM

## 2016-01-03 NOTE — Progress Notes (Signed)
Daily Session Note  Patient Details  Name: Henry Moreno MRN: [id number] Date of Birth: 03-04-1951 Referring Provider:    Encounter Date: 01/03/2016  Check In:     Session Check In - 01/03/16 1617    Check-In   Location [facility]-Cardiac & Pulmonary Rehab   Staff Present Heath Lark, RN, BSN, CCRP;Johari Pinney, DPT, Tomi Bamberger, RN, BSN   Supervising physician immediately available to respond to emergencies See telemetry face sheet for immediately available ER MD   Medication changes reported     No   Fall or balance concerns reported    No   Warm-up and Cool-down Performed on first and last piece of equipment   Resistance Training Performed Yes   VAD Patient? No         Goals Met:  Exercise tolerated well No report of cardiac concerns or symptoms  Goals Unmet:  Not Applicable  Comments: Patient completed exercise prescription and all exercise goals during rehab session. The exercise was tolerated well and the patient is progressing in the program.    Dr. Emily Filbert is Medical Director for [place] and [facility].

## 2016-01-03 NOTE — Progress Notes (Signed)
Daily Session Note  Patient Details  Name: Henry Moreno MRN: [id number] Date of Birth: Apr 08, 1951 Referring Provider:    Encounter Date: 01/03/2016  Check In:     Session Check In - 01/03/16 1617    Check-In   Location [facility]-Cardiac & Pulmonary Rehab   Staff Present Heath Lark, RN, BSN, CCRP;Rebecca Sickles, DPT, Tomi Bamberger, RN, BSN   Supervising physician immediately available to respond to emergencies See telemetry face sheet for immediately available ER MD   Medication changes reported     No   Fall or balance concerns reported    No   Warm-up and Cool-down Performed on first and last piece of equipment   Resistance Training Performed Yes   VAD Patient? No         Goals Met:  Independence with exercise equipment Exercise tolerated well No report of cardiac concerns or symptoms  Goals Unmet:  Not Applicable  Comments: Doing well with exercise prescription progression.    Dr. Emily Filbert is Medical Director for [place] and [facility].

## 2016-01-06 ENCOUNTER — Encounter: Payer: 59 | Admitting: *Deleted

## 2016-01-06 DIAGNOSIS — I5042 Chronic combined systolic (congestive) and diastolic (congestive) heart failure: Secondary | ICD-10-CM

## 2016-01-06 NOTE — Progress Notes (Signed)
Daily Session Note  Patient Details  Name: Henry Moreno MRN: [id number] Date of Birth: Dec 30, 1950 Referring Provider:    Encounter Date: 01/06/2016  Check In:     Session Check In - 01/06/16 1647    Check-In   Location [facility]   Staff Present Gerlene Burdock, RN, BSN;Diane Joya Gaskins, RN, Alex Gardener, DPT, CEEA   Supervising physician immediately available to respond to emergencies See telemetry face sheet for immediately available ER MD   Medication changes reported     No   Fall or balance concerns reported    No   Warm-up and Cool-down Performed on first and last piece of equipment   Resistance Training Performed Yes   VAD Patient? No         Goals Met:  Proper associated with RPD/PD & O2 Sat Exercise tolerated well  Goals Unmet:  Not Applicable  Comments:     Dr. Emily Filbert is Medical Director for [place] and [facility].

## 2016-01-12 DIAGNOSIS — I5042 Chronic combined systolic (congestive) and diastolic (congestive) heart failure: Secondary | ICD-10-CM | POA: Diagnosis not present

## 2016-01-12 NOTE — Progress Notes (Signed)
Daily Session Note  Patient Details  Name: Henry Moreno MRN: [id number] Date of Birth: March 24, 1951 Referring Provider:    Encounter Date: 01/12/2016  Check In:     Session Check In - 01/12/16 1612    Check-In   Location [facility]   Staff Present Gerlene Burdock, RN, Alex Gardener, DPT, CEEA;Diane Joya Gaskins, RN, BSN   Supervising physician immediately available to respond to emergencies See telemetry face sheet for immediately available ER MD   Medication changes reported     No   Fall or balance concerns reported    No   Warm-up and Cool-down Performed on first and last piece of equipment   Resistance Training Performed Yes   VAD Patient? No         Goals Met:  Independence with exercise equipment Exercise tolerated well No report of cardiac concerns or symptoms  Goals Unmet:  Not Applicable  Comments: Patient completed exercise prescription and all exercise goals during rehab session. The exercise was tolerated well and the patient is progressing in the program.    Dr. Emily Filbert is Medical Director for [place] and [facility].

## 2016-01-13 DIAGNOSIS — I5042 Chronic combined systolic (congestive) and diastolic (congestive) heart failure: Secondary | ICD-10-CM | POA: Diagnosis not present

## 2016-01-13 NOTE — Progress Notes (Signed)
Daily Session Note  Patient Details  Name: Henry Moreno MRN: [id number] Date of Birth: 11/11/1950 Referring Provider:    Encounter Date: 01/13/2016  Check In:      Goals Met:  Independence with exercise equipment Exercise tolerated well No report of cardiac concerns or symptoms  Goals Unmet:  Not Applicable  Comments: A new target heart rate range for exercise was calculated based on the patient's ability to exercise with increased intensity. The range is 40-85% of HRR and encompasses moderate-vigorous exercise. The target heart rate range is equivalent to an RPE of 12-17.    Dr. Emily Filbert is Medical Director for [place] and [facility].

## 2016-01-14 ENCOUNTER — Ambulatory Visit: Payer: 59

## 2016-01-16 ENCOUNTER — Encounter: Payer: Self-pay | Admitting: *Deleted

## 2016-01-16 DIAGNOSIS — I5042 Chronic combined systolic (congestive) and diastolic (congestive) heart failure: Secondary | ICD-10-CM

## 2016-01-16 NOTE — Progress Notes (Signed)
Cardiac Individual Treatment Plan  Patient Details  Name: Henry Moreno MRN: [id number] Date of Birth: 10-18-1950 Referring Provider:    Initial Encounter Date:       Cardiac Rehab from 11/10/2015 in [facility] Cardiac and Pulmonary Rehab   Date  11/10/15      Visit Diagnosis: Heart failure, systolic and diastolic, chronic ([place])  Patient's Home Medications on Admission:  Current outpatient prescriptions:  .  acetaminophen (TYLENOL) 500 MG tablet, Take 1,000 mg by mouth 2 (two) times daily., Disp: , Rfl:  .  apixaban (ELIQUIS) 5 MG TABS tablet, Take by mouth., Disp: , Rfl:  .  carvedilol (COREG) 25 MG tablet, , Disp: , Rfl:  .  cetirizine (ZYRTEC) 10 MG tablet, Take by mouth., Disp: , Rfl:  .  Cholecalciferol (VITAMIN D3) 3000 units TABS, Take 1 capsule by mouth 2 (two) times daily. Taking a  2090m dose, Disp: , Rfl:  .  Magnesium 200 MG TABS, Take by mouth., Disp: , Rfl:  .  Multiple Vitamin (MULTI VITAMIN DAILY) TABS, Take by mouth., Disp: , Rfl:  .  valsartan (DIOVAN) 80 MG tablet, , Disp: , Rfl:   Past Medical History: No past medical history on file.  Tobacco Use: History  Smoking status  . Not on file  Smokeless tobacco  . Not on file    Labs: Recent Review Flowsheet Data    There is no flowsheet data to display.       Exercise Target Goals:    Exercise Program Goal: Individual exercise prescription set with THRR, safety & activity barriers. Participant demonstrates ability to understand and report RPE using BORG scale, to self-measure pulse accurately, and to acknowledge the importance of the exercise prescription.  Exercise Prescription Goal: Starting with aerobic activity 30 plus minutes a day, 3 days per week for initial exercise prescription. Provide home exercise prescription and guidelines that participant acknowledges understanding prior to discharge.  Activity Barriers & Risk Stratification:     Activity Barriers & Cardiac Risk Stratification -  11/10/15 1321    Activity Barriers & Cardiac Risk Stratification   Activity Barriers Joint Problems;Deconditioning  Right shoulder/arm injury being followed by Dr KCaryl Comes   Still needs full evaluation. ICD  and other heart issues have taken precedent over this arm issue.   Cardiac Risk Stratification High      6 Minute Walk:     6 Minute Walk      11/10/15 1636 11/11/15 0950     6 Minute Walk   Phase Initial     Distance 1200 feet 1200 feet    Walk Time 6 minutes 6 minutes    MPH 2.3 2.3    RPE 12 12    Symptoms No No    Resting HR 78 bpm 78 bpm    Max Ex. HR 116 bpm 116 bpm       Initial Exercise Prescription:     Initial Exercise Prescription - 11/10/15 1600    Date of Initial Exercise RX and Referring Provider   Date 11/10/15   Treadmill   MPH 2   Grade 0   Minutes 15   Bike   Level 1   Minutes 15   Recumbant Bike   Level 2   RPM 15   Minutes 15   NuStep   Level 2   Watts 30   Minutes 15   Recumbant Elliptical   Level 2   RPM 30   Minutes 15   REL-XR  Level 2   Watts 40   Minutes 15   T5 Nustep   Level 2   Watts 30   Minutes 15   Biostep-RELP   Level 2   Watts 30   Minutes 15   Prescription Details   Frequency (times per week) 3   Duration Progress to 45 minutes of aerobic exercise without signs/symptoms of physical distress   Intensity   THRR REST +  30   Ratings of Perceived Exertion 11-15   Progression   Progression Continue to progress workloads to maintain intensity without signs/symptoms of physical distress.   Resistance Training   Training Prescription Yes   Weight 3   Reps 10-15      Perform Capillary Blood Glucose checks as needed.  Exercise Prescription Changes:     Exercise Prescription Changes      11/17/15 1600 12/09/15 1600 12/13/15 1600 01/06/16 1600 01/11/16 1400   Exercise Review   Progression _0    Response to Exercise   Blood Pressure (Admit)   134/80 mmHg 134/80 mmHg 138/80 mmHg   Blood  Pressure (Exercise)   142/78 mmHg 142/78 mmHg 130/70 mmHg   Blood Pressure (Exit)   128/78 mmHg 128/78 mmHg 128/60 mmHg   Heart Rate (Admit)   63 bpm 63 bpm 69 bpm   Heart Rate (Exercise)   89 bpm 89 bpm 95 bpm   Heart Rate (Exit)   66 bpm 66 bpm 94 bpm   Rating of Perceived Exertion (Exercise)   _1 Symptoms _2    Comments Reviewed individualized exercise prescription and made increases per departmental policy. Exercise increases were discussed with the patient and they were able to perform the new work loads without issue (no signs or symptoms).  Reviewed individualized exercise prescription and made increases per departmental policy. Exercise increases were discussed with the patient and they were able to perform the new work loads without issue (no signs or symptoms).  Reviewed individualized exercise prescription and made increases per departmental policy. Exercise increases were discussed with the patient and they were able to perform the new work loads without issue (no signs or symptoms).  Reviewed individualized exercise prescription and made increases per departmental policy. Exercise increases were discussed with the patient and they were able to perform the new work loads without issue (no signs or symptoms).     Duration Progress to 30 minutes of continuous aerobic without signs/symptoms of physical distress Progress to 30 minutes of continuous aerobic without signs/symptoms of physical distress Progress to 30 minutes of continuous aerobic without signs/symptoms of physical distress Progress to 30 minutes of continuous aerobic without signs/symptoms of physical distress Progress to 45 minutes of aerobic exercise without signs/symptoms of physical distress   Intensity Rest + 30 Rest + 30 Rest + 30 Rest + 30 THRR New  40-80 % THRR 65-126   Progression   Progression Continue progressive overload as per policy without signs/symptoms or physical distress. Continue  progressive overload as per policy without signs/symptoms or physical distress. Continue progressive overload as per policy without signs/symptoms or physical distress. Continue progressive overload as per policy without signs/symptoms or physical distress. Continue progressive overload as per policy without signs/symptoms or physical distress.   Resistance Training   Training Prescription _3    Weight _4 Reps 10-15 10-15 10-15 10-15 10-15   Interval Training   Interval Training   No  No No   Treadmill   MPH '2 2 2 2 2   '$ Grade 0 0 0 0 0   Minutes '15 15 15 15 15   '$ Bike   Level '1 1 1 1 1   '$ Minutes '15 15 15 15 15   '$ Recumbant Bike   Level '2 2 2 2 2   '$ RPM '15 15 15 15 15   '$ Minutes '15 15 15 15 15   '$ NuStep   Level '2 2 2 2 4   '$ Watts '30 30 30 30 '$ 40   Minutes '15 15 15 15 15   '$ Recumbant Elliptical   Level '2 2 2 2 2   '$ RPM '30 30 30 30 30   '$ Minutes '15 15 15 15 15   '$ REL-XR   Level '2 2 2 2 4   '$ Watts 40 40 40 40 80   Minutes '15 15 15 15 15   '$ T5 Nustep   Level '2 4 4 4 4   '$ Watts '30 25 25 25 '$ 40   Minutes '15 15 15 15 15   '$ Biostep-RELP   Level '3 3 3 3 4   '$ Watts '25 25 25 25 '$ 40   Minutes '15 15 15 15 15      '$ Exercise Comments:     Exercise Comments      12/17/15 1239 01/11/16 1445         Exercise Comments Henry Moreno has attended Heart Track sporadically but has made increases in exercise duration and intensity.  Goal will be to overcome barriers to more regular attendance Henry Moreno continues to attend Heart Track and is making improvement in exercise duration and intensity.  He remains cautious when exercising related to his past medical history.  He has experienced no untoward events during cardiac rehab.         Discharge Exercise Prescription (Final Exercise Prescription Changes):     Exercise Prescription Changes - 01/11/16 1400    Exercise Review   Progression Yes   Response to Exercise   Blood Pressure (Admit) 138/80 mmHg   Blood Pressure (Exercise) 130/70 mmHg    Blood Pressure (Exit) 128/60 mmHg   Heart Rate (Admit) 69 bpm   Heart Rate (Exercise) 95 bpm   Heart Rate (Exit) 94 bpm   Rating of Perceived Exertion (Exercise) 13   Symptoms None   Duration Progress to 45 minutes of aerobic exercise without signs/symptoms of physical distress   Intensity THRR New  40-80 % THRR 65-126   Progression   Progression Continue progressive overload as per policy without signs/symptoms or physical distress.   Resistance Training   Training Prescription Yes   Weight 4   Reps 10-15   Interval Training   Interval Training No   Treadmill   MPH 2   Grade 0   Minutes 15   Bike   Level 1   Minutes 15   Recumbant Bike   Level 2   RPM 15   Minutes 15   NuStep   Level 4   Watts 40   Minutes 15   Recumbant Elliptical   Level 2   RPM 30   Minutes 15   REL-XR   Level 4   Watts 80   Minutes 15   T5 Nustep   Level 4   Watts 40   Minutes 15   Biostep-RELP   Level 4   Watts 40   Minutes 15      Nutrition:  Target Goals: Understanding of  nutrition guidelines, daily intake of sodium '1500mg'$ , cholesterol '200mg'$ , calories 30% from fat and 7% or less from saturated fats, daily to have 5 or more servings of fruits and vegetables.  Biometrics:     Pre Biometrics - 11/10/15 1640    Pre Biometrics   Height '5\' 7"'$  (1.702 m)   Weight 227 lb (102.967 kg)   Waist Circumference 47 inches   Hip Circumference 48.25 inches   Waist to Hip Ratio 0.97 %   BMI (Calculated) 35.6       Nutrition Therapy Plan and Nutrition Goals:     Nutrition Therapy & Goals - 11/10/15 1300    Intervention Plan   Intervention Prescribe, educate and counsel regarding individualized specific dietary modifications aiming towards targeted core components such as weight, hypertension, lipid management, diabetes, heart failure and other comorbidities.;Nutrition handout(s) given to patient.   Expected Outcomes Short Term Goal: Understand basic principles of dietary content,  such as calories, fat, sodium, cholesterol and nutrients.;Short Term Goal: A plan has been developed with personal nutrition goals set during dietitian appointment.;Long Term Goal: Adherence to prescribed nutrition plan.      Nutrition Discharge: Rate Your Plate Scores:     Nutrition Assessments - 12/03/15 1317    Rate Your Plate Scores   Pre Score 68   Pre Score % 76 %      Nutrition Goals Re-Evaluation:   Psychosocial: Target Goals: Acknowledge presence or absence of depression, maximize coping skills, provide positive support system. Participant is able to verbalize types and ability to use techniques and skills needed for reducing stress and depression.  Initial Review & Psychosocial Screening:     Initial Psych Review & Screening - 11/11/15 0952    Initial Review   Current issues with Current Stress Concerns   Source of Stress Concerns Transportation;Chronic Illness   Family Dynamics   Good Support System? Yes   Screening Interventions   Interventions Encouraged to exercise      Quality of Life Scores:     Quality of Life - 11/10/15 1511    Quality of Life Scores   Health/Function Pre 19.87 %   Socioeconomic Pre 22.93 %   Psych/Spiritual Pre 23.86 %   Family Pre 30 %   GLOBAL Pre 22.81 %      PHQ-9:     Recent Review Flowsheet Data    Depression screen [facility] 2/9 11/10/2015   Decreased Interest 0   Down, Depressed, Hopeless 0   PHQ - 2 Score 0   Altered sleeping 0   Tired, decreased energy 3   Change in appetite 0   Feeling bad or failure about yourself  0   Trouble concentrating 0   Moving slowly or fidgety/restless 0   Suicidal thoughts 0   PHQ-9 Score 3   Difficult doing work/chores Somewhat difficult      Psychosocial Evaluation and Intervention:     Psychosocial Evaluation - 01/12/16 1644    Psychosocial Evaluation & Interventions   Comments Counselor met with Henry Moreno for initial psychosocial evaluation with him reporting a strong  support system with a spouse of 24 years and active involvement in his local church.  He has a pacemaker; sleep apnea and recently had a shoulder injury due to a fall.  He denies a history of depression or anxiety and reports he is generally in a positive mood.  He was to have another sleep study recently although he is currently on a CPAP.  Henry Moreno admits to  multiple stressors in his life including his health and his job.  Furthermore, both his mother and mother in law have dementia and this contributes to Henry Moreno stress level as well.  He has goals to increase his education, lose weight and increase his strength and stamina.        Psychosocial Re-Evaluation:     Psychosocial Re-Evaluation      01/12/16 1709           Psychosocial Re-Evaluation   Comments Follow up with Henry Moreno today reporting sleep study resulted in a new CPAP machine that is helping with more refreshing sleep at night.  He also reports the exercise is helping him feel stronger and more energetic as well.  When following with Henry Moreno about his ongoing stressors; he reports they remain the same, but he feels he is better equipped to coped with them at this time.  Counselor commended Henry Moreno for his progress made  and commitment to continue exercising on a regular basis.            Vocational Rehabilitation: Provide vocational rehab assistance to qualifying candidates.   Vocational Rehab Evaluation & Intervention:   Education: Education Goals: Education classes will be provided on a weekly basis, covering required topics. Participant will state understanding/return demonstration of topics presented.  Learning Barriers/Preferences:     Learning Barriers/Preferences - 11/10/15 1323    Learning Barriers/Preferences   Learning Barriers None   Learning Preferences None      Education Topics: General Nutrition Guidelines/Fats and Fiber: -Group instruction provided by verbal, written material,  models and posters to present the general guidelines for heart healthy nutrition. Gives an explanation and review of dietary fats and fiber.   Controlling Sodium/Reading Food Labels: -Group verbal and written material supporting the discussion of sodium use in heart healthy nutrition. Review and explanation with models, verbal and written materials for utilization of the food label.   Exercise Physiology & Risk Factors: - Group verbal and written instruction with models to review the exercise physiology of the cardiovascular system and associated critical values. Details cardiovascular disease risk factors and the goals associated with each risk factor.   Aerobic Exercise & Resistance Training: - Gives group verbal and written discussion on the health impact of inactivity. On the components of aerobic and resistive training programs and the benefits of this training and how to safely progress through these programs.   Flexibility, Balance, General Exercise Guidelines: - Provides group verbal and written instruction on the benefits of flexibility and balance training programs. Provides general exercise guidelines with specific guidelines to those with heart or lung disease. Demonstration and skill practice provided.   Stress Management: - Provides group verbal and written instruction about the health risks of elevated stress, cause of high stress, and healthy ways to reduce stress.   Depression: - Provides group verbal and written instruction on the correlation between heart/lung disease and depressed mood, treatment options, and the stigmas associated with seeking treatment.   Anatomy & Physiology of the Heart: - Group verbal and written instruction and models provide basic cardiac anatomy and physiology, with the coronary electrical and arterial systems. Review of: AMI, Angina, Valve disease, Heart Failure, Cardiac Arrhythmia, Pacemakers, and the ICD.   Cardiac Procedures: - Group  verbal and written instruction and models to describe the testing methods done to diagnose heart disease. Reviews the outcomes of the test results. Describes the treatment choices: Medical Management, Angioplasty, or Coronary Bypass Surgery.  Cardiac Rehab from 01/12/2016 in [facility] Cardiac and Pulmonary Rehab   Date  01/03/16   Educator  SB   Instruction Review Code  2- meets goals/outcomes      Cardiac Medications: - Group verbal and written instruction to review commonly prescribed medications for heart disease. Reviews the medication, class of the drug, and side effects. Includes the steps to properly store meds and maintain the prescription regimen.      Cardiac Rehab from 01/12/2016 in [facility] Cardiac and Pulmonary Rehab   Date  01/12/16   Educator  DW   Instruction Review Code  2- meets goals/outcomes      Go Sex-Intimacy & Heart Disease, Get SMART - Goal Setting: - Group verbal and written instruction through game format to discuss heart disease and the return to sexual intimacy. Provides group verbal and written material to discuss and apply goal setting through the application of the S.M.A.R.T. Method.      Cardiac Rehab from 01/12/2016 in [facility] Cardiac and Pulmonary Rehab   Date  01/03/16   Educator  SB   Instruction Review Code  2- meets goals/outcomes      Other Matters of the Heart: - Provides group verbal, written materials and models to describe Heart Failure, Angina, Valve Disease, and Diabetes in the realm of heart disease. Includes description of the disease process and treatment options available to the cardiac patient.      Cardiac Rehab from 01/12/2016 in [facility] Cardiac and Pulmonary Rehab   Date  12/13/15   Educator  SB   Instruction Review Code  2- meets goals/outcomes      Exercise & Equipment Safety: - Individual verbal instruction and demonstration of equipment use and safety with use of the equipment.      Cardiac Rehab from 01/12/2016 in [facility] Cardiac and  Pulmonary Rehab   Date  11/10/15   Educator  Sb   Instruction Review Code  2- meets goals/outcomes      Infection Prevention: - Provides verbal and written material to individual with discussion of infection control including proper hand washing and proper equipment cleaning during exercise session.      Cardiac Rehab from 01/12/2016 in [facility] Cardiac and Pulmonary Rehab   Date  11/10/15   Educator  Sb   Instruction Review Code  2- meets goals/outcomes      Falls Prevention: - Provides verbal and written material to individual with discussion of falls prevention and safety.      Cardiac Rehab from 01/12/2016 in [facility] Cardiac and Pulmonary Rehab   Date  11/10/15   Educator  SB   Instruction Review Code  2- meets goals/outcomes      Diabetes: - Individual verbal and written instruction to review signs/symptoms of diabetes, desired ranges of glucose level fasting, after meals and with exercise. Advice that pre and post exercise glucose checks will be done for 3 sessions at entry of program.    Knowledge Questionnaire Score:     Knowledge Questionnaire Score - 11/10/15 1323    Knowledge Questionnaire Score   Pre Score 24/28      Core Components/Risk Factors/Patient Goals at Admission:     Personal Goals and Risk Factors at Admission - 11/11/15 0951    Core Components/Risk Factors/Patient Goals on Admission    Weight Management Yes;Obesity   Intervention Weight Management: Develop a combined nutrition and exercise program designed to reach desired caloric intake, while maintaining appropriate intake of nutrient and fiber, sodium and fats, and appropriate energy  expenditure required for the weight goal.;Weight Management: Provide education and appropriate resources to help participant work on and attain dietary goals.;Weight Management/Obesity: Establish reasonable short term and long term weight goals.;Obesity: Provide education and appropriate resources to help participant work on  and attain dietary goals.   Admit Weight 220 lb (99.791 kg)   Goal Weight: Short Term 216 lb (97.977 kg)   Goal Weight: Long Term 174 lb (78.926 kg)   Expected Outcomes Short Term: Continue to assess and modify interventions until short term weight is achieved.;Long Term: Adherence to nutrition and physical activity/exercise program aimed toward attainment of established weight goal.   Sedentary Yes   Intervention Provide advice, education, support and counseling about physical activity/exercise needs.;Develop an individualized exercise prescription for aerobic and resistive training based on initial evaluation findings, risk stratification, comorbidities and participant's personal goals.   Expected Outcomes Achievement of increased cardiorespiratory fitness and enhanced flexibility, muscular endurance and strength shown through measurements of functional capaciy and personal statement of participant.   Hypertension Yes   Intervention Provide education on lifestyle modifcations including regular physical activity/exercise, weight management, moderate sodium restriction and increased consumption of fresh fruit, vegetables, and low fat dairy, alcohol moderation, and smoking cessation.;Monitor prescription use compliance.   Expected Outcomes Short Term: Continued assessment and intervention until BP is < 140/73m HG in hypertensive participants. < 130/818mHG in hypertensive participants with diabetes, heart failure or chronic kidney disease.;Long Term: Maintenance of blood pressure at goal levels.   Lipids Yes   Intervention Provide education and support for participant on nutrition & aerobic/resistive exercise along with prescribed medications to achieve LDL '70mg'$ , HDL >'40mg'$ .   Expected Outcomes Short Term: Participant states understanding of desired cholesterol values and is compliant with medications prescribed. Participant is following exercise prescription and nutrition guidelines.;Long Term:  Cholesterol controlled with medications as prescribed, with individualized exercise RX and with personalized nutrition plan. Value goals: LDL < '70mg'$ , HDL > 40 mg.   Personal Goal Other Yes   Personal Goal Improve my quality of life   Intervention Provide nutrition and exercise guidelines as with other goals   Expected Outcomes Increase in quality of life as verbalized by more energy to go home and spend time with my family.      Core Components/Risk Factors/Patient Goals Review:      Goals and Risk Factor Review      12/08/15 1722           Core Components/Risk Factors/Patient Goals Review   Personal Goals Review Weight Management/Obesity       Review I made a dietician appt for [place] has committed to writing down what he eats or putting it on his phone program. Henry Proudill also track his steps. He will try to walk 10 minutes at work since he says exercise in the past has helped him lose weight.        Expected Outcomes Lose weight and eat healthier.           Core Components/Risk Factors/Patient Goals at Discharge (Final Review):      Goals and Risk Factor Review - 12/08/15 1722    Core Components/Risk Factors/Patient Goals Review   Personal Goals Review Weight Management/Obesity   Review I made a dietician appt for Je[place]atherbyHe has committed to writing down what he eats or putting it on his phone program. Henry Proudill also track his steps. He will try to walk 10 minutes at work since he says exercise in the past has helped  him lose weight.    Expected Outcomes Lose weight and eat healthier.       ITP Comments:     ITP Comments      11/10/15 1249 11/18/15 1516 12/08/15 1720 12/19/15 1026 12/19/15 1027   ITP Comments Medical review completed   Initial ITP continue with ITP 30 Day Review. Continue with the ITP.  New to program.  I made a dietician appt for Henry Moreno. He has committed to writing down what he eats or putting it on his phone program. Henry Moreno will also track his steps. He will  try to walk 10 minutes at work since he says exercise in the past has helped him lose weight.  30 Day review. Continue with ITP 3 visits all mid April     01/16/16 1050           ITP Comments 30 day review. Continue with ITP. 4 visits since last review          Comments:

## 2016-01-25 ENCOUNTER — Ambulatory Visit: Payer: 59

## 2016-01-25 ENCOUNTER — Ambulatory Visit: Admission: RE | Admit: 2016-01-25 | Payer: 59 | Source: Ambulatory Visit

## 2016-01-26 ENCOUNTER — Other Ambulatory Visit: Payer: Self-pay

## 2016-01-27 ENCOUNTER — Other Ambulatory Visit: Payer: Self-pay | Admitting: Surgery

## 2016-01-28 ENCOUNTER — Other Ambulatory Visit: Payer: Self-pay | Admitting: Surgery

## 2016-01-28 DIAGNOSIS — M75121 Complete rotator cuff tear or rupture of right shoulder, not specified as traumatic: Secondary | ICD-10-CM

## 2016-01-31 ENCOUNTER — Encounter: Payer: 59 | Attending: Internal Medicine

## 2016-01-31 DIAGNOSIS — I5042 Chronic combined systolic (congestive) and diastolic (congestive) heart failure: Secondary | ICD-10-CM | POA: Insufficient documentation

## 2016-02-09 ENCOUNTER — Ambulatory Visit
Admission: RE | Admit: 2016-02-09 | Discharge: 2016-02-09 | Disposition: A | Discharge status: Home / Self Care | Payer: 59 | Source: Ambulatory Visit | Attending: Surgery | Admitting: Surgery

## 2016-02-09 DIAGNOSIS — M75121 Complete rotator cuff tear or rupture of right shoulder, not specified as traumatic: Secondary | ICD-10-CM

## 2016-02-16 ENCOUNTER — Encounter: Payer: Self-pay | Admitting: *Deleted

## 2016-02-16 ENCOUNTER — Telehealth: Payer: Self-pay | Admitting: *Deleted

## 2016-02-16 DIAGNOSIS — I5042 Chronic combined systolic (congestive) and diastolic (congestive) heart failure: Secondary | ICD-10-CM

## 2016-02-16 NOTE — Telephone Encounter (Signed)
Called to check on status to return to program. Has been busy with family  and  Work. Plans to return next week.

## 2016-02-16 NOTE — Progress Notes (Signed)
Cardiac Individual Treatment Plan  Patient Details  Name: Henry Moreno MRN: [id number] Date of Birth: 10-18-1950 Referring Provider:    Initial Encounter Date:       Cardiac Rehab from 11/10/2015 in [facility] Cardiac and Pulmonary Rehab   Date  11/10/15      Visit Diagnosis: Heart failure, systolic and diastolic, chronic ([place])  Patient's Home Medications on Admission:  Current outpatient prescriptions:  .  acetaminophen (TYLENOL) 500 MG tablet, Take 1,000 mg by mouth 2 (two) times daily., Disp: , Rfl:  .  apixaban (ELIQUIS) 5 MG TABS tablet, Take by mouth., Disp: , Rfl:  .  carvedilol (COREG) 25 MG tablet, , Disp: , Rfl:  .  cetirizine (ZYRTEC) 10 MG tablet, Take by mouth., Disp: , Rfl:  .  Cholecalciferol (VITAMIN D3) 3000 units TABS, Take 1 capsule by mouth 2 (two) times daily. Taking a  2090m dose, Disp: , Rfl:  .  Magnesium 200 MG TABS, Take by mouth., Disp: , Rfl:  .  Multiple Vitamin (MULTI VITAMIN DAILY) TABS, Take by mouth., Disp: , Rfl:  .  valsartan (DIOVAN) 80 MG tablet, , Disp: , Rfl:   Past Medical History: No past medical history on file.  Tobacco Use: History  Smoking status  . Not on file  Smokeless tobacco  . Not on file    Labs: Recent Review Flowsheet Data    There is no flowsheet data to display.       Exercise Target Goals:    Exercise Program Goal: Individual exercise prescription set with THRR, safety & activity barriers. Participant demonstrates ability to understand and report RPE using BORG scale, to self-measure pulse accurately, and to acknowledge the importance of the exercise prescription.  Exercise Prescription Goal: Starting with aerobic activity 30 plus minutes a day, 3 days per week for initial exercise prescription. Provide home exercise prescription and guidelines that participant acknowledges understanding prior to discharge.  Activity Barriers & Risk Stratification:     Activity Barriers & Cardiac Risk Stratification -  11/10/15 1321    Activity Barriers & Cardiac Risk Stratification   Activity Barriers Joint Problems;Deconditioning  Right shoulder/arm injury being followed by Dr KCaryl Comes   Still needs full evaluation. ICD  and other heart issues have taken precedent over this arm issue.   Cardiac Risk Stratification High      6 Minute Walk:     6 Minute Walk      11/10/15 1636 11/11/15 0950     6 Minute Walk   Phase Initial     Distance 1200 feet 1200 feet    Walk Time 6 minutes 6 minutes    MPH 2.3 2.3    RPE 12 12    Symptoms No No    Resting HR 78 bpm 78 bpm    Max Ex. HR 116 bpm 116 bpm       Initial Exercise Prescription:     Initial Exercise Prescription - 11/10/15 1600    Date of Initial Exercise RX and Referring Provider   Date 11/10/15   Treadmill   MPH 2   Grade 0   Minutes 15   Bike   Level 1   Minutes 15   Recumbant Bike   Level 2   RPM 15   Minutes 15   NuStep   Level 2   Watts 30   Minutes 15   Recumbant Elliptical   Level 2   RPM 30   Minutes 15   REL-XR  Level 2   Watts 40   Minutes 15   T5 Nustep   Level 2   Watts 30   Minutes 15   Biostep-RELP   Level 2   Watts 30   Minutes 15   Prescription Details   Frequency (times per week) 3   Duration Progress to 45 minutes of aerobic exercise without signs/symptoms of physical distress   Intensity   THRR REST +  30   Ratings of Perceived Exertion 11-15   Progression   Progression Continue to progress workloads to maintain intensity without signs/symptoms of physical distress.   Resistance Training   Training Prescription Yes   Weight 3   Reps 10-15      Perform Capillary Blood Glucose checks as needed.  Exercise Prescription Changes:     Exercise Prescription Changes      11/17/15 1600 12/09/15 1600 12/13/15 1600 01/06/16 1600 01/11/16 1400   Exercise Review   Progression _0    Response to Exercise   Blood Pressure (Admit)   134/80 mmHg 134/80 mmHg 138/80 mmHg   Blood  Pressure (Exercise)   142/78 mmHg 142/78 mmHg 130/70 mmHg   Blood Pressure (Exit)   128/78 mmHg 128/78 mmHg 128/60 mmHg   Heart Rate (Admit)   63 bpm 63 bpm 69 bpm   Heart Rate (Exercise)   89 bpm 89 bpm 95 bpm   Heart Rate (Exit)   66 bpm 66 bpm 94 bpm   Rating of Perceived Exertion (Exercise)   _1 Symptoms _2    Comments Reviewed individualized exercise prescription and made increases per departmental policy. Exercise increases were discussed with the patient and they were able to perform the new work loads without issue (no signs or symptoms).  Reviewed individualized exercise prescription and made increases per departmental policy. Exercise increases were discussed with the patient and they were able to perform the new work loads without issue (no signs or symptoms).  Reviewed individualized exercise prescription and made increases per departmental policy. Exercise increases were discussed with the patient and they were able to perform the new work loads without issue (no signs or symptoms).  Reviewed individualized exercise prescription and made increases per departmental policy. Exercise increases were discussed with the patient and they were able to perform the new work loads without issue (no signs or symptoms).     Duration Progress to 30 minutes of continuous aerobic without signs/symptoms of physical distress Progress to 30 minutes of continuous aerobic without signs/symptoms of physical distress Progress to 30 minutes of continuous aerobic without signs/symptoms of physical distress Progress to 30 minutes of continuous aerobic without signs/symptoms of physical distress Progress to 45 minutes of aerobic exercise without signs/symptoms of physical distress   Intensity Rest + 30 Rest + 30 Rest + 30 Rest + 30 THRR New  40-80 % THRR 65-126   Progression   Progression Continue progressive overload as per policy without signs/symptoms or physical distress. Continue  progressive overload as per policy without signs/symptoms or physical distress. Continue progressive overload as per policy without signs/symptoms or physical distress. Continue progressive overload as per policy without signs/symptoms or physical distress. Continue progressive overload as per policy without signs/symptoms or physical distress.   Resistance Training   Training Prescription _3    Weight _4 Reps 10-15 10-15 10-15 10-15 10-15   Interval Training   Interval Training   No  No No   Treadmill   MPH _0 Grade 0 0 0 0 0   Minutes _1 Bike   Level _2 Minutes _3 Recumbant Bike   Level _4 RPM _5 Minutes _6 NuStep   Level _7 Watts _8 40   Minutes _9 Recumbant Elliptical   Level _10 RPM _11 Minutes _12 REL-XR   Level _13 Watts 40 40 40 40 80   Minutes _14 T5 Nustep   Level _15 Watts _16 40   Minutes _17 Biostep-RELP   Level _18 Watts _19 40   Minutes _20 Exercise Comments:     Exercise Comments      12/17/15 1239 01/11/16 1445 02/10/16 1401       Exercise Comments Henry Moreno has attended Heart Track sporadically but has made increases in exercise duration and intensity.  Goal will be to overcome barriers to more regular attendance Henry Moreno continues to attend Heart Track and is making improvement in exercise duration and intensity.  He remains cautious when exercising related to his past medical history.  He has experienced no untoward events during cardiac rehab. Henry Moreno has not attended since 01/13/16        Discharge Exercise Prescription (Final Exercise Prescription Changes):     Exercise Prescription Changes - 01/11/16 1400    Exercise Review   Progression Yes   Response to Exercise   Blood Pressure (Admit)  138/80 mmHg   Blood Pressure (Exercise) 130/70 mmHg   Blood Pressure (Exit) 128/60 mmHg   Heart Rate (Admit) 69 bpm   Heart Rate (Exercise) 95 bpm   Heart Rate (Exit) 94 bpm   Rating of Perceived Exertion (Exercise) 13   Symptoms None   Duration Progress to 45 minutes of aerobic exercise without signs/symptoms of physical distress   Intensity THRR New  40-80 % THRR 65-126   Progression   Progression Continue progressive overload as per policy without signs/symptoms or physical distress.   Resistance Training   Training Prescription Yes   Weight 4   Reps 10-15   Interval Training   Interval Training No   Treadmill   MPH 2   Grade 0   Minutes 15   Bike   Level 1   Minutes 15   Recumbant Bike   Level 2   RPM 15   Minutes 15   NuStep   Level 4   Watts 40   Minutes 15   Recumbant Elliptical   Level 2   RPM 30   Minutes 15   REL-XR   Level 4   Watts 80   Minutes 15   T5 Nustep   Level 4   Watts 40   Minutes 15   Biostep-RELP   Level 4   Watts 40   Minutes 15  Nutrition:  Target Goals: Understanding of nutrition guidelines, daily intake of sodium <1569m, cholesterol <2091m calories 30% from fat and 7% or less from saturated fats, daily to have 5 or more servings of fruits and vegetables.  Biometrics:     Pre Biometrics - 11/10/15 1640    Pre Biometrics   Height _0  (1.702 m)   Weight 227 lb (102.967 kg)   Waist Circumference 47 inches   Hip Circumference 48.25 inches   Waist to Hip Ratio 0.97 %   BMI (Calculated) 35.6       Nutrition Therapy Plan and Nutrition Goals:     Nutrition Therapy & Goals - 11/10/15 1300    Intervention Plan   Intervention Prescribe, educate and counsel regarding individualized specific dietary modifications aiming towards targeted core components such as weight, hypertension, lipid management, diabetes, heart failure and other comorbidities.;Nutrition handout(s) given to patient.   Expected Outcomes Short Term  Goal: Understand basic principles of dietary content, such as calories, fat, sodium, cholesterol and nutrients.;Short Term Goal: A plan has been developed with personal nutrition goals set during dietitian appointment.;Long Term Goal: Adherence to prescribed nutrition plan.      Nutrition Discharge: Rate Your Plate Scores:     Nutrition Assessments - 12/03/15 1317    Rate Your Plate Scores   Pre Score 68   Pre Score % 76 %      Nutrition Goals Re-Evaluation:   Psychosocial: Target Goals: Acknowledge presence or absence of depression, maximize coping skills, provide positive support system. Participant is able to verbalize types and ability to use techniques and skills needed for reducing stress and depression.  Initial Review & Psychosocial Screening:     Initial Psych Review & Screening - 11/11/15 0952    Initial Review   Current issues with Current Stress Concerns   Source of Stress Concerns Transportation;Chronic Illness   Family Dynamics   Good Support System? Yes   Screening Interventions   Interventions Encouraged to exercise      Quality of Life Scores:     Quality of Life - 11/10/15 1511    Quality of Life Scores   Health/Function Pre 19.87 %   Socioeconomic Pre 22.93 %   Psych/Spiritual Pre 23.86 %   Family Pre 30 %   GLOBAL Pre 22.81 %      PHQ-9:     Recent Review Flowsheet Data    [facility]   Decreased Interest 0   Down, Depressed, Hopeless 0   PHQ - 2 Score 0   Altered sleeping 0   Tired, decreased energy 3   Change in appetite 0   Feeling bad or failure about yourself  0   Trouble concentrating 0   Moving slowly or fidgety/restless 0   Suicidal thoughts 0   PHQ-9 Score 3   Difficult doing work/chores Somewhat difficult      Psychosocial Evaluation and Intervention:     Psychosocial Evaluation - 01/12/16 1644    Psychosocial Evaluation & Interventions   Comments Counselor met with Henry Moreno initial  psychosocial evaluation with him reporting a strong support system with a spouse of 24 years and active involvement in his local church.  He has a pacemaker; sleep apnea and recently had a shoulder injury due to a fall.  He denies a history of depression or anxiety and reports he is generally in a positive mood.  He was to have another sleep study recently although he is currently on a  CPAP.  Henry Moreno admits to multiple stressors in his life including his health and his job.  Furthermore, both his mother and mother in law have dementia and this contributes to Henry. Moreno stress level as well.  He has goals to increase his education, lose weight and increase his strength and stamina.        Psychosocial Re-Evaluation:     Psychosocial Re-Evaluation      01/12/16 1709           Psychosocial Re-Evaluation   Comments Follow up with Henry Moreno today reporting sleep study resulted in a new CPAP machine that is helping with more refreshing sleep at night.  He also reports the exercise is helping him feel stronger and more energetic as well.  When following with Henry Moreno about his ongoing stressors; he reports they remain the same, but he feels he is better equipped to coped with them at this time.  Counselor commended Henry Moreno for his progress made  and commitment to continue exercising on a regular basis.            Vocational Rehabilitation: Provide vocational rehab assistance to qualifying candidates.   Vocational Rehab Evaluation & Intervention:   Education: Education Goals: Education classes will be provided on a weekly basis, covering required topics. Participant will state understanding/return demonstration of topics presented.  Learning Barriers/Preferences:     Learning Barriers/Preferences - 11/10/15 1323    Learning Barriers/Preferences   Learning Barriers None   Learning Preferences None      Education Topics: General Nutrition Guidelines/Fats and Fiber: -Group  instruction provided by verbal, written material, models and posters to present the general guidelines for heart healthy nutrition. Gives an explanation and review of dietary fats and fiber.   Controlling Sodium/Reading Food Labels: -Group verbal and written material supporting the discussion of sodium use in heart healthy nutrition. Review and explanation with models, verbal and written materials for utilization of the food label.   Exercise Physiology & Risk Factors: - Group verbal and written instruction with models to review the exercise physiology of the cardiovascular system and associated critical values. Details cardiovascular disease risk factors and the goals associated with each risk factor.   Aerobic Exercise & Resistance Training: - Gives group verbal and written discussion on the health impact of inactivity. On the components of aerobic and resistive training programs and the benefits of this training and how to safely progress through these programs.   Flexibility, Balance, General Exercise Guidelines: - Provides group verbal and written instruction on the benefits of flexibility and balance training programs. Provides general exercise guidelines with specific guidelines to those with heart or lung disease. Demonstration and skill practice provided.   Stress Management: - Provides group verbal and written instruction about the health risks of elevated stress, cause of high stress, and healthy ways to reduce stress.   Depression: - Provides group verbal and written instruction on the correlation between heart/lung disease and depressed mood, treatment options, and the stigmas associated with seeking treatment.   Anatomy & Physiology of the Heart: - Group verbal and written instruction and models provide basic cardiac anatomy and physiology, with the coronary electrical and arterial systems. Review of: AMI, Angina, Valve disease, Heart Failure, Cardiac Arrhythmia, Pacemakers,  and the ICD.   Cardiac Procedures: - Group verbal and written instruction and models to describe the testing methods done to diagnose heart disease. Reviews the outcomes of the test results. Describes the treatment choices: Medical Management, Angioplasty, or  Coronary Bypass Surgery.          Cardiac Rehab from 01/12/2016 in [facility] Cardiac and Pulmonary Rehab   Date  01/03/16   Educator  SB   Instruction Review Code  2- meets goals/outcomes      Cardiac Medications: - Group verbal and written instruction to review commonly prescribed medications for heart disease. Reviews the medication, class of the drug, and side effects. Includes the steps to properly store meds and maintain the prescription regimen.      Cardiac Rehab from 01/12/2016 in [facility] Cardiac and Pulmonary Rehab   Date  01/12/16   Educator  DW   Instruction Review Code  2- meets goals/outcomes      Go Sex-Intimacy & Heart Disease, Get SMART - Goal Setting: - Group verbal and written instruction through game format to discuss heart disease and the return to sexual intimacy. Provides group verbal and written material to discuss and apply goal setting through the application of the S.M.A.R.T. Method.      Cardiac Rehab from 01/12/2016 in [facility] Cardiac and Pulmonary Rehab   Date  01/03/16   Educator  SB   Instruction Review Code  2- meets goals/outcomes      Other Matters of the Heart: - Provides group verbal, written materials and models to describe Heart Failure, Angina, Valve Disease, and Diabetes in the realm of heart disease. Includes description of the disease process and treatment options available to the cardiac patient.      Cardiac Rehab from 01/12/2016 in [facility] Cardiac and Pulmonary Rehab   Date  12/13/15   Educator  SB   Instruction Review Code  2- meets goals/outcomes      Exercise & Equipment Safety: - Individual verbal instruction and demonstration of equipment use and safety with use of the equipment.       Cardiac Rehab from 01/12/2016 in [facility] Cardiac and Pulmonary Rehab   Date  11/10/15   Educator  Sb   Instruction Review Code  2- meets goals/outcomes      Infection Prevention: - Provides verbal and written material to individual with discussion of infection control including proper hand washing and proper equipment cleaning during exercise session.      Cardiac Rehab from 01/12/2016 in [facility] Cardiac and Pulmonary Rehab   Date  11/10/15   Educator  Sb   Instruction Review Code  2- meets goals/outcomes      Falls Prevention: - Provides verbal and written material to individual with discussion of falls prevention and safety.      Cardiac Rehab from 01/12/2016 in [facility] Cardiac and Pulmonary Rehab   Date  11/10/15   Educator  SB   Instruction Review Code  2- meets goals/outcomes      Diabetes: - Individual verbal and written instruction to review signs/symptoms of diabetes, desired ranges of glucose level fasting, after meals and with exercise. Advice that pre and post exercise glucose checks will be done for 3 sessions at entry of program.    Knowledge Questionnaire Score:     Knowledge Questionnaire Score - 11/10/15 1323    Knowledge Questionnaire Score   Pre Score 24/28      Core Components/Risk Factors/Patient Goals at Admission:     Personal Goals and Risk Factors at Admission - 11/11/15 0951    Core Components/Risk Factors/Patient Goals on Admission    Weight Management Yes;Obesity   Intervention Weight Management: Develop a combined nutrition and exercise program designed to reach desired caloric intake, while maintaining  appropriate intake of nutrient and fiber, sodium and fats, and appropriate energy expenditure required for the weight goal.;Weight Management: Provide education and appropriate resources to help participant work on and attain dietary goals.;Weight Management/Obesity: Establish reasonable short term and long term weight goals.;Obesity: Provide education and  appropriate resources to help participant work on and attain dietary goals.   Admit Weight 220 lb (99.791 kg)   Goal Weight: Short Term 216 lb (97.977 kg)   Goal Weight: Long Term 174 lb (78.926 kg)   Expected Outcomes Short Term: Continue to assess and modify interventions until short term weight is achieved.;Long Term: Adherence to nutrition and physical activity/exercise program aimed toward attainment of established weight goal.   Sedentary Yes   Intervention Provide advice, education, support and counseling about physical activity/exercise needs.;Develop an individualized exercise prescription for aerobic and resistive training based on initial evaluation findings, risk stratification, comorbidities and participant's personal goals.   Expected Outcomes Achievement of increased cardiorespiratory fitness and enhanced flexibility, muscular endurance and strength shown through measurements of functional capaciy and personal statement of participant.   Hypertension Yes   Intervention Provide education on lifestyle modifcations including regular physical activity/exercise, weight management, moderate sodium restriction and increased consumption of fresh fruit, vegetables, and low fat dairy, alcohol moderation, and smoking cessation.;Monitor prescription use compliance.   Expected Outcomes Short Term: Continued assessment and intervention until BP is < 140/74m HG in hypertensive participants. < 130/859mHG in hypertensive participants with diabetes, heart failure or chronic kidney disease.;Long Term: Maintenance of blood pressure at goal levels.   Lipids Yes   Intervention Provide education and support for participant on nutrition & aerobic/resistive exercise along with prescribed medications to achieve LDL <7037mHDL >52m76m Expected Outcomes Short Term: Participant states understanding of desired cholesterol values and is compliant with medications prescribed. Participant is following exercise  prescription and nutrition guidelines.;Long Term: Cholesterol controlled with medications as prescribed, with individualized exercise RX and with personalized nutrition plan. Value goals: LDL < 70mg18mL > 40 mg.   Personal Goal Other Yes   Personal Goal Improve my quality of life   Intervention Provide nutrition and exercise guidelines as with other goals   Expected Outcomes Increase in quality of life as verbalized by more energy to go home and spend time with my family.      Core Components/Risk Factors/Patient Goals Review:      Goals and Risk Factor Review      12/08/15 1722           Core Components/Risk Factors/Patient Goals Review   Personal Goals Review Weight Management/Obesity       Review I made a dietician appt for [place] writing down what he eats or putting it on his phone program. Jeff Henry Moreno also track his steps. He will try to walk 10 minutes at work since he says exercise in the past has helped him lose weight.        Expected Outcomes Lose weight and eat healthier.           Core Components/Risk Factors/Patient Goals at Discharge (Final Review):      Goals and Risk Factor Review - 12/08/15 1722    Core Components/Risk Factors/Patient Goals Review   Personal Goals Review Weight Management/Obesity   Review I made a dietician appt [place] writing down what he eats or putting it on his phone program. Jeff Henry Moreno also track his steps. He will try to walk 10  minutes at work since he says exercise in the past has helped him lose weight.    Expected Outcomes Lose weight and eat healthier.       ITP Comments:     ITP Comments      11/10/15 1249 11/18/15 1516 12/08/15 1720 12/19/15 1026 12/19/15 1027   ITP Comments Medical review completed   Initial ITP continue with ITP 30 Day Review. Continue with the ITP.  New to program.  I made a dietician appt for Henry Moreno. He has committed to writing down what he eats or putting it on his phone  program. Henry Moreno will also track his steps. He will try to walk 10 minutes at work since he says exercise in the past has helped him lose weight.  30 Day review. Continue with ITP 3 visits all mid April     01/16/16 1050 02/10/16 1400 02/10/16 1452 02/16/16 0939     ITP Comments 30 day review. Continue with ITP. 4 visits since last review Henry Moreno has not attended since 01/13/16 last session in cardiac rehab was 01/13/16 30 day review.  Continue with ITP  Last visit 01/13/2016       Comments:

## 2016-02-21 DIAGNOSIS — I5042 Chronic combined systolic (congestive) and diastolic (congestive) heart failure: Secondary | ICD-10-CM | POA: Diagnosis present

## 2016-02-21 NOTE — Progress Notes (Signed)
Daily Session Note  Patient Details  Name: Henry Moreno MRN: [id number] Date of Birth: Apr 19, 1951 Referring Provider:    Encounter Date: 02/21/2016  Check In:     Session Check In - 02/21/16 1627    Check-In   Location [facility]   Staff Present Earlean Shawl, BS, ACSM CEP, Exercise Physiologist;Taliana Mersereau Oletta Darter, BA, ACSM CEP, Exercise Physiologist;Diane Joya Gaskins, RN, BSN   Supervising physician immediately available to respond to emergencies See telemetry face sheet for immediately available ER MD   Medication changes reported     No   Fall or balance concerns reported    No   Warm-up and Cool-down Performed on first and last piece of equipment   Resistance Training Performed Yes   VAD Patient? No   Pain Assessment   Currently in Pain? No/denies   Multiple Pain Sites No         Goals Met:  Independence with exercise equipment Exercise tolerated well No report of cardiac concerns or symptoms Strength training completed today  Goals Unmet:  Not Applicable  Comments: Mr Kiernan first day back after absence -   Dr. Emily Filbert is Medical Director for [facility].

## 2016-02-23 ENCOUNTER — Ambulatory Visit: Payer: 59

## 2016-02-24 ENCOUNTER — Ambulatory Visit: Payer: 59

## 2016-02-28 ENCOUNTER — Ambulatory Visit: Payer: 59

## 2016-03-01 ENCOUNTER — Ambulatory Visit: Payer: 59

## 2016-03-02 ENCOUNTER — Ambulatory Visit: Payer: 59

## 2016-03-06 ENCOUNTER — Ambulatory Visit: Payer: 59

## 2016-03-07 NOTE — Progress Notes (Unsigned)
Henry Moreno attended one session on 6/26.  He has not been back since that date and last visit prior was 01/13/16.

## 2016-03-08 ENCOUNTER — Ambulatory Visit: Payer: 59

## 2016-03-09 ENCOUNTER — Ambulatory Visit: Payer: 59

## 2016-03-13 ENCOUNTER — Ambulatory Visit: Payer: 59

## 2016-03-15 ENCOUNTER — Ambulatory Visit: Payer: 59

## 2016-03-16 ENCOUNTER — Ambulatory Visit: Payer: 59

## 2016-03-20 ENCOUNTER — Ambulatory Visit: Payer: 59

## 2016-03-21 DIAGNOSIS — Z79899 Other long term (current) drug therapy: Secondary | ICD-10-CM | POA: Insufficient documentation

## 2016-03-22 ENCOUNTER — Ambulatory Visit: Payer: 59

## 2016-03-22 ENCOUNTER — Telehealth: Payer: Self-pay

## 2016-03-22 DIAGNOSIS — I5042 Chronic combined systolic (congestive) and diastolic (congestive) heart failure: Secondary | ICD-10-CM

## 2016-03-22 NOTE — Telephone Encounter (Signed)
Called to check on status to return to program. LMOM 

## 2016-03-23 ENCOUNTER — Ambulatory Visit: Payer: 59

## 2016-03-27 ENCOUNTER — Ambulatory Visit: Payer: 59

## 2016-03-29 ENCOUNTER — Ambulatory Visit: Payer: 59

## 2016-03-30 ENCOUNTER — Ambulatory Visit: Payer: 59

## 2016-04-03 ENCOUNTER — Ambulatory Visit: Payer: 59

## 2016-04-05 ENCOUNTER — Ambulatory Visit: Payer: 59

## 2016-04-06 ENCOUNTER — Ambulatory Visit: Payer: 59

## 2016-04-06 DIAGNOSIS — I5042 Chronic combined systolic (congestive) and diastolic (congestive) heart failure: Secondary | ICD-10-CM

## 2016-04-10 ENCOUNTER — Ambulatory Visit: Payer: 59

## 2016-04-12 ENCOUNTER — Ambulatory Visit: Payer: 59

## 2016-04-12 ENCOUNTER — Encounter: Payer: Self-pay | Admitting: *Deleted

## 2016-04-12 DIAGNOSIS — I5042 Chronic combined systolic (congestive) and diastolic (congestive) heart failure: Secondary | ICD-10-CM

## 2016-04-12 NOTE — Progress Notes (Signed)
Cardiac Individual Treatment Plan  Patient Details  Name: Henry Moreno MRN: [id number] Date of Birth: 10/11/1950 Referring Provider:    Initial Encounter Date:  Flowsheet Row Cardiac Rehab from 11/10/2015 in [facility] Cardiac and Pulmonary Rehab  Date  11/10/15      Visit Diagnosis: Heart failure, systolic and diastolic, chronic ([place])  Patient's Home Medications on Admission:  Current Outpatient Prescriptions:  .  acetaminophen (TYLENOL) 500 MG tablet, Take 1,000 mg by mouth 2 (two) times daily., Disp: , Rfl:  .  apixaban (ELIQUIS) 5 MG TABS tablet, Take by mouth., Disp: , Rfl:  .  carvedilol (COREG) 25 MG tablet, , Disp: , Rfl:  .  cetirizine (ZYRTEC) 10 MG tablet, Take by mouth., Disp: , Rfl:  .  Cholecalciferol (VITAMIN D3) 3000 units TABS, Take 1 capsule by mouth 2 (two) times daily. Taking a  2030m dose, Disp: , Rfl:  .  Magnesium 200 MG TABS, Take by mouth., Disp: , Rfl:  .  Multiple Vitamin (MULTI VITAMIN DAILY) TABS, Take by mouth., Disp: , Rfl:  .  valsartan (DIOVAN) 80 MG tablet, , Disp: , Rfl:   Past Medical History: No past medical history on file.  Tobacco Use: History  Smoking Status  . Not on file  Smokeless Tobacco  . Not on file    Labs: Recent Review Flowsheet Data    There is no flowsheet data to display.       Exercise Target Goals:    Exercise Program Goal: Individual exercise prescription set with THRR, safety & activity barriers. Participant demonstrates ability to understand and report RPE using BORG scale, to self-measure pulse accurately, and to acknowledge the importance of the exercise prescription.  Exercise Prescription Goal: Starting with aerobic activity 30 plus minutes a day, 3 days per week for initial exercise prescription. Provide home exercise prescription and guidelines that participant acknowledges understanding prior to discharge.  Activity Barriers & Risk Stratification:     Activity Barriers & Cardiac Risk Stratification  - 11/10/15 1321      Activity Barriers & Cardiac Risk Stratification   Activity Barriers Joint Problems;Deconditioning  Right shoulder/arm injury being followed by Dr KCaryl Comes   Still needs full evaluation. ICD  and other heart issues have taken precedent over this arm issue.   Cardiac Risk Stratification High      6 Minute Walk:     6 Minute Walk    Row Name 11/10/15 1636 11/11/15 0950       6 Minute Walk   Phase Initial  -    Distance 1200 feet 1200 feet    Walk Time 6 minutes 6 minutes    MPH 2.3 2.3    RPE 12 12    Symptoms No No    Resting HR 78 bpm 78 bpm    Max Ex. HR 116 bpm 116 bpm       Initial Exercise Prescription:     Initial Exercise Prescription - 11/10/15 1600      Date of Initial Exercise RX and Referring Provider   Date 11/10/15     Treadmill   MPH 2   Grade 0   Minutes 15     Bike   Level 1   Minutes 15     Recumbant Bike   Level 2   RPM 15   Minutes 15     NuStep   Level 2   Watts 30   Minutes 15     Recumbant Elliptical   Level 2  RPM 30   Minutes 15     REL-XR   Level 2   Watts 40   Minutes 15     T5 Nustep   Level 2   Watts 30   Minutes 15     Biostep-RELP   Level 2   Watts 30   Minutes 15     Prescription Details   Frequency (times per week) 3   Duration Progress to 45 minutes of aerobic exercise without signs/symptoms of physical distress     Intensity   THRR REST +  30   Ratings of Perceived Exertion 11-15     Progression   Progression Continue to progress workloads to maintain intensity without signs/symptoms of physical distress.     Resistance Training   Training Prescription Yes   Weight 3   Reps 10-15      Perform Capillary Blood Glucose checks as needed.  Exercise Prescription Changes:     Exercise Prescription Changes    Row Name 11/17/15 1600 12/09/15 1600 12/13/15 1600 01/06/16 1600 01/11/16 1400     Exercise Review   Progression _0      Response to Exercise    Blood Pressure (Admit)  -  - 134/80 134/80 138/80   Blood Pressure (Exercise)  -  - 142/78 142/78 130/70   Blood Pressure (Exit)  -  - 128/78 128/78 128/60   Heart Rate (Admit)  -  - 63 bpm 63 bpm 69 bpm   Heart Rate (Exercise)  -  - 89 bpm 89 bpm 95 bpm   Heart Rate (Exit)  -  - 66 bpm 66 bpm 94 bpm   Rating of Perceived Exertion (Exercise)  -  - _1 Symptoms _2    Comments Reviewed individualized exercise prescription and made increases per departmental policy. Exercise increases were discussed with the patient and they were able to perform the new work loads without issue (no signs or symptoms).  Reviewed individualized exercise prescription and made increases per departmental policy. Exercise increases were discussed with the patient and they were able to perform the new work loads without issue (no signs or symptoms).  Reviewed individualized exercise prescription and made increases per departmental policy. Exercise increases were discussed with the patient and they were able to perform the new work loads without issue (no signs or symptoms).  Reviewed individualized exercise prescription and made increases per departmental policy. Exercise increases were discussed with the patient and they were able to perform the new work loads without issue (no signs or symptoms).   -   Duration Progress to 30 minutes of continuous aerobic without signs/symptoms of physical distress Progress to 30 minutes of continuous aerobic without signs/symptoms of physical distress Progress to 30 minutes of continuous aerobic without signs/symptoms of physical distress Progress to 30 minutes of continuous aerobic without signs/symptoms of physical distress Progress to 45 minutes of aerobic exercise without signs/symptoms of physical distress   Intensity Rest + 30 Rest + 30 Rest + 30 Rest + 30 THRR New  40-80 % THRR 65-126     Progression   Progression Continue progressive overload as per policy  without signs/symptoms or physical distress. Continue progressive overload as per policy without signs/symptoms or physical distress. Continue progressive overload as per policy without signs/symptoms or physical distress. Continue progressive overload as per policy without signs/symptoms or physical distress. Continue progressive overload as per policy without signs/symptoms or physical distress.  Resistance Training   Training Prescription _0    Weight _1 Reps 10-15 10-15 10-15 10-15 10-15     Interval Training   Interval Training  -  - No No No     Treadmill   MPH _2 Grade 0 0 0 0 0   Minutes _3 Bike   Level _4 Minutes _5 Recumbant Bike   Level _6 RPM _7 Minutes _8 NuStep   Level _9 Watts _10 40   Minutes _11 Recumbant Elliptical   Level _12 RPM _13 Minutes _14 REL-XR   Level _15 Watts 40 40 40 40 80   Minutes _16 T5 Nustep   Level _17 Watts _18 40   Minutes _19 Biostep-RELP   Level _20 Watts _21 40   Minutes _22 Row Name 02/25/16 1100             Exercise Review   Progression No         Response to Exercise   Blood Pressure (Admit) 124/72       Blood Pressure (Exercise) 150/82       Blood Pressure (Exit) 122/68       Heart Rate (Admit) 79 bpm       Heart Rate (Exercise) 80 bpm       Heart Rate (Exit) 79 bpm       Rating of Perceived Exertion (Exercise) 13       Symptoms none       Duration Progress to 45 minutes of aerobic exercise without signs/symptoms of physical distress       Intensity THRR unchanged         Progression   Progression Continue to progress workloads to maintain intensity without signs/symptoms of physical distress.         Interval Training   Interval  Training No         REL-XR   Level 7       Watts 99       Minutes 15         Biostep-RELP   Level 3       Watts 44       Minutes 20          Exercise Comments:     Exercise Comments    Row Name 12/17/15 1239 01/11/16 1445 02/10/16 1401 02/25/16 1104 03/22/16 1524   Exercise Comments Merry Proud has attended Heart Track sporadically but has made increases in exercise duration and intensity.  Goal will be to overcome barriers to more regular attendance Merry Proud continues to attend Heart Track and is making improvement in exercise duration and intensity.  He remains cautious when exercising related to his past medical history.  He has experienced no untoward events during cardiac rehab. Mr Cohen has not attended since 01/13/16 Mr Vanwingerden has just returned after missing several weeks.  Exercise prescirption was reviewed and he stated understanding of workloads and safety. Mr Stowers has not attended since 02/21/16.  Left message to check on status of return to program.   Row Name 04/06/16 1537           Exercise Comments Mr Masoner has not attended since 02/21/16.          Discharge Exercise Prescription (Final Exercise Prescription Changes):     Exercise Prescription Changes - 02/25/16 1100      Exercise Review   Progression No     Response to Exercise   Blood Pressure (Admit) 124/72   Blood Pressure (Exercise) 150/82   Blood Pressure (Exit) 122/68   Heart Rate (Admit) 79 bpm   Heart Rate (Exercise) 80 bpm   Heart Rate (Exit) 79 bpm   Rating of Perceived Exertion (Exercise) 13   Symptoms none   Duration Progress to 45 minutes of aerobic exercise without signs/symptoms of physical distress   Intensity THRR unchanged     Progression   Progression Continue to progress workloads to maintain intensity without signs/symptoms of physical distress.     Interval Training   Interval Training No     REL-XR   Level 7   Watts 99   Minutes 15     Biostep-RELP   Level 3   Watts 44    Minutes 20      Nutrition:  Target Goals: Understanding of nutrition guidelines, daily intake of sodium <1541m, cholesterol <2032m calories 30% from fat and 7% or less from saturated fats, daily to have 5 or more servings of fruits and vegetables.  Biometrics:     Pre Biometrics - 11/10/15 1640      Pre Biometrics   Height _0  (1.702 m)   Weight 227 lb (103 kg)   Waist Circumference 47 inches   Hip Circumference 48.25 inches   Waist to Hip Ratio 0.97 %   BMI (Calculated) 35.6       Nutrition Therapy Plan and Nutrition Goals:     Nutrition Therapy & Goals - 11/10/15 1300      Intervention Plan   Intervention Prescribe, educate and counsel regarding individualized specific dietary modifications aiming towards targeted core components such as weight, hypertension, lipid management, diabetes, heart failure and other comorbidities.;Nutrition handout(s) given to patient.   Expected Outcomes Short Term Goal: Understand basic principles of dietary content, such as calories, fat, sodium, cholesterol and nutrients.;Short Term Goal: A plan has been developed with personal nutrition goals set during dietitian appointment.;Long Term Goal: Adherence to prescribed nutrition plan.      Nutrition Discharge: Rate Your Plate Scores:     Nutrition Assessments - 12/03/15 1317      Rate Your Plate Scores   Pre Score 68   Pre Score % 76 %      Nutrition Goals Re-Evaluation:   Psychosocial: Target Goals: Acknowledge presence or absence of depression, maximize coping skills, provide positive support system. Participant is able to verbalize types and ability to use techniques and skills needed for reducing stress and depression.  Initial Review & Psychosocial Screening:     Initial Psych Review & Screening - 11/11/15 0952      Initial Review   Current issues with Current Stress Concerns   Source of Stress Concerns Transportation;Chronic Illness     Family Dynamics  Good  Support System? Yes     Screening Interventions   Interventions Encouraged to exercise      Quality of Life Scores:     Quality of Life - 11/10/15 1511      Quality of Life Scores   Health/Function Pre 19.87 %   Socioeconomic Pre 22.93 %   Psych/Spiritual Pre 23.86 %   Family Pre 30 %   GLOBAL Pre 22.81 %      PHQ-9: Recent Review Flowsheet Data    Depression screen [facility] 2/9 11/10/2015   Decreased Interest 0   Down, Depressed, Hopeless 0   PHQ - 2 Score 0   Altered sleeping 0   Tired, decreased energy 3   Change in appetite 0   Feeling bad or failure about yourself  0   Trouble concentrating 0   Moving slowly or fidgety/restless 0   Suicidal thoughts 0   PHQ-9 Score 3   Difficult doing work/chores Somewhat difficult      Psychosocial Evaluation and Intervention:     Psychosocial Evaluation - 01/12/16 1644      Psychosocial Evaluation & Interventions   Comments Counselor met with Mr. Mcmanamon for initial psychosocial evaluation with him reporting a strong support system with a spouse of 24 years and active involvement in his local church.  He has a pacemaker; sleep apnea and recently had a shoulder injury due to a fall.  He denies a history of depression or anxiety and reports he is generally in a positive mood.  He was to have another sleep study recently although he is currently on a CPAP.  Mr. Emanuelson admits to multiple stressors in his life including his health and his job.  Furthermore, both his mother and mother in law have dementia and this contributes to Mr. Bradway stress level as well.  He has goals to increase his education, lose weight and increase his strength and stamina.        Psychosocial Re-Evaluation:     Psychosocial Re-Evaluation    [place] Name 01/12/16 1709             Psychosocial Re-Evaluation   Comments Follow up with Mr. Sawa today reporting sleep study resulted in a new CPAP machine that is helping with more refreshing sleep at  night.  He also reports the exercise is helping him feel stronger and more energetic as well.  When following with Mr. Schaberg about his ongoing stressors; he reports they remain the same, but he feels he is better equipped to coped with them at this time.  Counselor commended Mr. Dubie for his progress made  and commitment to continue exercising on a regular basis.            Vocational Rehabilitation: Provide vocational rehab assistance to qualifying candidates.   Vocational Rehab Evaluation & Intervention:   Education: Education Goals: Education classes will be provided on a weekly basis, covering required topics. Participant will state understanding/return demonstration of topics presented.  Learning Barriers/Preferences:     Learning Barriers/Preferences - 11/10/15 1323      Learning Barriers/Preferences   Learning Barriers None   Learning Preferences None      Education Topics: General Nutrition Guidelines/Fats and Fiber: -Group instruction provided by verbal, written material, models and posters to present the general guidelines for heart healthy nutrition. Gives an explanation and review of dietary fats and fiber.   Controlling Sodium/Reading Food Labels: -Group verbal and written material supporting the discussion of sodium use in heart  healthy nutrition. Review and explanation with models, verbal and written materials for utilization of the food label.   Exercise Physiology & Risk Factors: - Group verbal and written instruction with models to review the exercise physiology of the cardiovascular system and associated critical values. Details cardiovascular disease risk factors and the goals associated with each risk factor.   Aerobic Exercise & Resistance Training: - Gives group verbal and written discussion on the health impact of inactivity. On the components of aerobic and resistive training programs and the benefits of this training and how to safely progress  through these programs.   Flexibility, Balance, General Exercise Guidelines: - Provides group verbal and written instruction on the benefits of flexibility and balance training programs. Provides general exercise guidelines with specific guidelines to those with heart or lung disease. Demonstration and skill practice provided.   Stress Management: - Provides group verbal and written instruction about the health risks of elevated stress, cause of high stress, and healthy ways to reduce stress.   Depression: - Provides group verbal and written instruction on the correlation between heart/lung disease and depressed mood, treatment options, and the stigmas associated with seeking treatment.   Anatomy & Physiology of the Heart: - Group verbal and written instruction and models provide basic cardiac anatomy and physiology, with the coronary electrical and arterial systems. Review of: AMI, Angina, Valve disease, Heart Failure, Cardiac Arrhythmia, Pacemakers, and the ICD. Flowsheet Row Cardiac Rehab from 02/21/2016 in [facility] Cardiac and Pulmonary Rehab  Date  02/21/16  Educator  DW  Instruction Review Code  2- meets goals/outcomes      Cardiac Procedures: - Group verbal and written instruction and models to describe the testing methods done to diagnose heart disease. Reviews the outcomes of the test results. Describes the treatment choices: Medical Management, Angioplasty, or Coronary Bypass Surgery. Flowsheet Row Cardiac Rehab from 02/21/2016 in [facility] Cardiac and Pulmonary Rehab  Date  01/03/16  Educator  SB  Instruction Review Code  2- meets goals/outcomes      Cardiac Medications: - Group verbal and written instruction to review commonly prescribed medications for heart disease. Reviews the medication, class of the drug, and side effects. Includes the steps to properly store meds and maintain the prescription regimen. Flowsheet Row Cardiac Rehab from 02/21/2016 in [facility] Cardiac and Pulmonary  Rehab  Date  01/12/16  Educator  DW  Instruction Review Code  2- meets goals/outcomes      Go Sex-Intimacy & Heart Disease, Get SMART - Goal Setting: - Group verbal and written instruction through game format to discuss heart disease and the return to sexual intimacy. Provides group verbal and written material to discuss and apply goal setting through the application of the S.M.A.R.T. Method. Flowsheet Row Cardiac Rehab from 02/21/2016 in [facility] Cardiac and Pulmonary Rehab  Date  01/03/16  Educator  SB  Instruction Review Code  2- meets goals/outcomes      Other Matters of the Heart: - Provides group verbal, written materials and models to describe Heart Failure, Angina, Valve Disease, and Diabetes in the realm of heart disease. Includes description of the disease process and treatment options available to the cardiac patient. Flowsheet Row Cardiac Rehab from 02/21/2016 in [facility] Cardiac and Pulmonary Rehab  Date  12/13/15  Educator  SB  Instruction Review Code  2- meets goals/outcomes      Exercise & Equipment Safety: - Individual verbal instruction and demonstration of equipment use and safety with use of the equipment. Flowsheet Row Cardiac Rehab from  02/21/2016 in [facility] Cardiac and Pulmonary Rehab  Date  11/10/15  Educator  Sb  Instruction Review Code  2- meets goals/outcomes      Infection Prevention: - Provides verbal and written material to individual with discussion of infection control including proper hand washing and proper equipment cleaning during exercise session. Flowsheet Row Cardiac Rehab from 02/21/2016 in [facility] Cardiac and Pulmonary Rehab  Date  11/10/15  Educator  Sb  Instruction Review Code  2- meets goals/outcomes      Falls Prevention: - Provides verbal and written material to individual with discussion of falls prevention and safety. Flowsheet Row Cardiac Rehab from 02/21/2016 in [facility] Cardiac and Pulmonary Rehab  Date  11/10/15  Educator  SB  Instruction  Review Code  2- meets goals/outcomes      Diabetes: - Individual verbal and written instruction to review signs/symptoms of diabetes, desired ranges of glucose level fasting, after meals and with exercise. Advice that pre and post exercise glucose checks will be done for 3 sessions at entry of program.    Knowledge Questionnaire Score:     Knowledge Questionnaire Score - 11/10/15 1323      Knowledge Questionnaire Score   Pre Score 24/28      Core Components/Risk Factors/Patient Goals at Admission:     Personal Goals and Risk Factors at Admission - 11/11/15 0951      Core Components/Risk Factors/Patient Goals on Admission    Weight Management Yes;Obesity   Intervention Weight Management: Develop a combined nutrition and exercise program designed to reach desired caloric intake, while maintaining appropriate intake of nutrient and fiber, sodium and fats, and appropriate energy expenditure required for the weight goal.;Weight Management: Provide education and appropriate resources to help participant work on and attain dietary goals.;Weight Management/Obesity: Establish reasonable short term and long term weight goals.;Obesity: Provide education and appropriate resources to help participant work on and attain dietary goals.   Admit Weight 220 lb (99.8 kg)   Goal Weight: Short Term 216 lb (98 kg)   Goal Weight: Long Term 174 lb (78.9 kg)   Expected Outcomes Short Term: Continue to assess and modify interventions until short term weight is achieved.;Long Term: Adherence to nutrition and physical activity/exercise program aimed toward attainment of established weight goal.   Sedentary Yes   Intervention Provide advice, education, support and counseling about physical activity/exercise needs.;Develop an individualized exercise prescription for aerobic and resistive training based on initial evaluation findings, risk stratification, comorbidities and participant's personal goals.   Expected  Outcomes Achievement of increased cardiorespiratory fitness and enhanced flexibility, muscular endurance and strength shown through measurements of functional capaciy and personal statement of participant.   Hypertension Yes   Intervention Provide education on lifestyle modifcations including regular physical activity/exercise, weight management, moderate sodium restriction and increased consumption of fresh fruit, vegetables, and low fat dairy, alcohol moderation, and smoking cessation.;Monitor prescription use compliance.   Expected Outcomes Short Term: Continued assessment and intervention until BP is < 140/71m HG in hypertensive participants. < 130/882mHG in hypertensive participants with diabetes, heart failure or chronic kidney disease.;Long Term: Maintenance of blood pressure at goal levels.   Lipids Yes   Intervention Provide education and support for participant on nutrition & aerobic/resistive exercise along with prescribed medications to achieve LDL <7043mHDL >65m84m Expected Outcomes Short Term: Participant states understanding of desired cholesterol values and is compliant with medications prescribed. Participant is following exercise prescription and nutrition guidelines.;Long Term: Cholesterol controlled with medications as prescribed, with individualized exercise  RX and with personalized nutrition plan. Value goals: LDL < '70mg'$ , HDL > 40 mg.   Personal Goal Other Yes   Personal Goal Improve my quality of life   Intervention Provide nutrition and exercise guidelines as with other goals   Expected Outcomes Increase in quality of life as verbalized by more energy to go home and spend time with my family.      Core Components/Risk Factors/Patient Goals Review:      Goals and Risk Factor Review    Row Name 12/08/15 1722             Core Components/Risk Factors/Patient Goals Review   Personal Goals Review Weight Management/Obesity       Review I made a dietician appt for [place].  He has committed to writing down what he eats or putting it on his phone program. Merry Proud will also track his steps. He will try to walk 10 minutes at work since he says exercise in the past has helped him lose weight.        Expected Outcomes Lose weight and eat healthier.           Core Components/Risk Factors/Patient Goals at Discharge (Final Review):      Goals and Risk Factor Review - 12/08/15 1722      Core Components/Risk Factors/Patient Goals Review   Personal Goals Review Weight Management/Obesity   Review I made a dietician appt for [place]. He has committed to writing down what he eats or putting it on his phone program. Merry Proud will also track his steps. He will try to walk 10 minutes at work since he says exercise in the past has helped him lose weight.    Expected Outcomes Lose weight and eat healthier.       ITP Comments:     ITP Comments    Row Name 11/10/15 1249 11/18/15 1516 12/08/15 1720 12/19/15 1026 12/19/15 1027   ITP Comments Medical review completed   Initial ITP continue with ITP 30 Day Review. Continue with the ITP.  New to program.  I made a dietician appt for Merry Proud. He has committed to writing down what he eats or putting it on his phone program. Merry Proud will also track his steps. He will try to walk 10 minutes at work since he says exercise in the past has helped him lose weight.  30 Day review. Continue with ITP 3 visits all mid April   Row Name 01/16/16 1050 02/10/16 1400 02/10/16 1452 02/16/16 0939 03/07/16 1621   ITP Comments 30 day review. Continue with ITP. 4 visits since last review Mr Engelmann has not attended since 01/13/16 last session in cardiac rehab was 01/13/16 30 day review.  Continue with ITP  Last visit 01/13/2016 Mr Satz attended one session on 6/26.  He has not been back since that date and last visit prior was 01/13/16.    [place] Name 03/22/16 1525 04/12/16 0838         ITP Comments Mr Settles has not attended since 02/21/16.  Left message to check on status  of return to program. 30 day review. Continue with ITP unless changes noted by Medical Director at signature of review.         Comments:

## 2016-04-13 ENCOUNTER — Ambulatory Visit: Payer: 59

## 2016-04-17 ENCOUNTER — Ambulatory Visit: Payer: 59

## 2016-04-19 ENCOUNTER — Ambulatory Visit: Payer: 59

## 2016-04-20 ENCOUNTER — Ambulatory Visit: Payer: 59

## 2016-04-20 DIAGNOSIS — I5042 Chronic combined systolic (congestive) and diastolic (congestive) heart failure: Secondary | ICD-10-CM

## 2016-04-20 NOTE — Progress Notes (Signed)
Discharge Summary  Patient Details  Name: Henry Moreno MRN: [id number] Date of Birth: 1950/12/23 Referring Provider:     Number of Visits: 17  Reason for Discharge:  Early Exit:  Personal  Smoking History:  History  Smoking Status  . Not on file  Smokeless Tobacco  . Not on file    Diagnosis:  Heart failure, systolic and diastolic, chronic (HCC)  ADL [facility]:   Initial Exercise Prescription:     Initial Exercise Prescription - 11/10/15 1600      Date of Initial Exercise RX and Referring Provider   Date 11/10/15     Treadmill   MPH 2   Grade 0   Minutes 15     Bike   Level 1   Minutes 15     Recumbant Bike   Level 2   RPM 15   Minutes 15     NuStep   Level 2   Watts 30   Minutes 15     Recumbant Elliptical   Level 2   RPM 30   Minutes 15     REL-XR   Level 2   Watts 40   Minutes 15     T5 Nustep   Level 2   Watts 30   Minutes 15     Biostep-RELP   Level 2   Watts 30   Minutes 15     Prescription Details   Frequency (times per week) 3   Duration Progress to 45 minutes of aerobic exercise without signs/symptoms of physical distress     Intensity   THRR REST +  30   Ratings of Perceived Exertion 11-15     Progression   Progression Continue to progress workloads to maintain intensity without signs/symptoms of physical distress.     Resistance Training   Training Prescription Yes   Weight 3   Reps 10-15      Discharge Exercise Prescription (Final Exercise Prescription Changes):     Exercise Prescription Changes - 02/25/16 1100      Exercise Review   Progression No     Response to Exercise   Blood Pressure (Admit) 124/72   Blood Pressure (Exercise) 150/82   Blood Pressure (Exit) 122/68   Heart Rate (Admit) 79 bpm   Heart Rate (Exercise) 80 bpm   Heart Rate (Exit) 79 bpm   Rating of Perceived Exertion (Exercise) 13   Symptoms none   Duration Progress to 45 minutes of aerobic exercise without signs/symptoms of physical  distress   Intensity THRR unchanged     Progression   Progression Continue to progress workloads to maintain intensity without signs/symptoms of physical distress.     Interval Training   Interval Training No     REL-XR   Level 7   Watts 99   Minutes 15     Biostep-RELP   Level 3   Watts 44   Minutes 20      Functional Capacity:     6 Minute Walk    Row Name 11/10/15 1636 11/11/15 0950       6 Minute Walk   Phase Initial  -    Distance 1200 feet 1200 feet    Walk Time 6 minutes 6 minutes    MPH 2.3 2.3    RPE 12 12    Symptoms No No    Resting HR 78 bpm 78 bpm    Max Ex. HR 116 bpm 116 bpm       Psychological, QOL,  Others - Outcomes: PHQ 2/9: Depression screen PHQ 2/9 11/10/2015  Decreased Interest 0  Down, Depressed, Hopeless 0  PHQ - 2 Score 0  Altered sleeping 0  Tired, decreased energy 3  Change in appetite 0  Feeling bad or failure about yourself  0  Trouble concentrating 0  Moving slowly or fidgety/restless 0  Suicidal thoughts 0  PHQ-9 Score 3  Difficult doing work/chores Somewhat difficult    Quality of Life:     Quality of Life - 11/10/15 1511      Quality of Life Scores   Health/Function Pre 19.87 %   Socioeconomic Pre 22.93 %   Psych/Spiritual Pre 23.86 %   Family Pre 30 %   GLOBAL Pre 22.81 %      Personal Goals: Goals established at orientation with interventions provided to work toward goal.     Personal Goals and Risk Factors at Admission - 11/11/15 0951      Core Components/Risk Factors/Patient Goals on Admission    Weight Management Yes;Obesity   Intervention Weight Management: Develop a combined nutrition and exercise program designed to reach desired caloric intake, while maintaining appropriate intake of nutrient and fiber, sodium and fats, and appropriate energy expenditure required for the weight goal.;Weight Management: Provide education and appropriate resources to help participant work on and attain dietary  goals.;Weight Management/Obesity: Establish reasonable short term and long term weight goals.;Obesity: Provide education and appropriate resources to help participant work on and attain dietary goals.   Admit Weight 220 lb (99.8 kg)   Goal Weight: Short Term 216 lb (98 kg)   Goal Weight: Long Term 174 lb (78.9 kg)   Expected Outcomes Short Term: Continue to assess and modify interventions until short term weight is achieved.;Long Term: Adherence to nutrition and physical activity/exercise program aimed toward attainment of established weight goal.   Sedentary Yes   Intervention Provide advice, education, support and counseling about physical activity/exercise needs.;Develop an individualized exercise prescription for aerobic and resistive training based on initial evaluation findings, risk stratification, comorbidities and participant's personal goals.   Expected Outcomes Achievement of increased cardiorespiratory fitness and enhanced flexibility, muscular endurance and strength shown through measurements of functional capaciy and personal statement of participant.   Hypertension Yes   Intervention Provide education on lifestyle modifcations including regular physical activity/exercise, weight management, moderate sodium restriction and increased consumption of fresh fruit, vegetables, and low fat dairy, alcohol moderation, and smoking cessation.;Monitor prescription use compliance.   Expected Outcomes Short Term: Continued assessment and intervention until BP is < 140/26mm HG in hypertensive participants. < 130/23mm HG in hypertensive participants with diabetes, heart failure or chronic kidney disease.;Long Term: Maintenance of blood pressure at goal levels.   Lipids Yes   Intervention Provide education and support for participant on nutrition & aerobic/resistive exercise along with prescribed medications to achieve LDL 70mg , HDL >40mg .   Expected Outcomes Short Term: Participant states understanding  of desired cholesterol values and is compliant with medications prescribed. Participant is following exercise prescription and nutrition guidelines.;Long Term: Cholesterol controlled with medications as prescribed, with individualized exercise RX and with personalized nutrition plan. Value goals: LDL < 70mg , HDL > 40 mg.   Personal Goal Other Yes   Personal Goal Improve my quality of life   Intervention Provide nutrition and exercise guidelines as with other goals   Expected Outcomes Increase in quality of life as verbalized by more energy to go home and spend time with my family.       Personal Goals  Discharge:     Goals and Risk Factor Review    Row Name 12/08/15 1722             Core Components/Risk Factors/Patient Goals Review   Personal Goals Review Weight Management/Obesity       Review I made a dietician appt for [place]. He has committed to writing down what he eats or putting it on his phone program. Henry Moreno will also track his steps. He will try to walk 10 minutes at work since he says exercise in the past has helped him lose weight.        Expected Outcomes Lose weight and eat healthier.           Nutrition & Weight - Outcomes:     Pre Biometrics - 11/10/15 1640      Pre Biometrics   Height 5\' 7"  (1.702 m)   Weight 227 lb (103 kg)   Waist Circumference 47 inches   Hip Circumference 48.25 inches   Waist to Hip Ratio 0.97 %   BMI (Calculated) 35.6       Nutrition:     Nutrition Therapy & Goals - 11/10/15 1300      Intervention Plan   Intervention Prescribe, educate and counsel regarding individualized specific dietary modifications aiming towards targeted core components such as weight, hypertension, lipid management, diabetes, heart failure and other comorbidities.;Nutrition handout(s) given to patient.   Expected Outcomes Short Term Goal: Understand basic principles of dietary content, such as calories, fat, sodium, cholesterol and nutrients.;Short Term Goal: A  plan has been developed with personal nutrition goals set during dietitian appointment.;Long Term Goal: Adherence to prescribed nutrition plan.      Nutrition Discharge:     Nutrition Assessments - 12/03/15 1317      Rate Your Plate Scores   Pre Score 68   Pre Score % 76 %      Education Questionnaire Score:     Knowledge Questionnaire Score - 11/10/15 1323      Knowledge Questionnaire Score   Pre Score 24/28      Goals reviewed with patient; copy given to patient.

## 2016-04-20 NOTE — Progress Notes (Signed)
Cardiac Individual Treatment Plan  Patient Details  Name: Henry Moreno MRN: [id number] Date of Birth: 10/11/1950 Referring Provider:    Initial Encounter Date:  Flowsheet Row Cardiac Rehab from 11/10/2015 in [facility] Cardiac and Pulmonary Rehab  Date  11/10/15      Visit Diagnosis: Heart failure, systolic and diastolic, chronic ([place])  Patient's Home Medications on Admission:  Current Outpatient Prescriptions:  .  acetaminophen (TYLENOL) 500 MG tablet, Take 1,000 mg by mouth 2 (two) times daily., Disp: , Rfl:  .  apixaban (ELIQUIS) 5 MG TABS tablet, Take by mouth., Disp: , Rfl:  .  carvedilol (COREG) 25 MG tablet, , Disp: , Rfl:  .  cetirizine (ZYRTEC) 10 MG tablet, Take by mouth., Disp: , Rfl:  .  Cholecalciferol (VITAMIN D3) 3000 units TABS, Take 1 capsule by mouth 2 (two) times daily. Taking a  2030m dose, Disp: , Rfl:  .  Magnesium 200 MG TABS, Take by mouth., Disp: , Rfl:  .  Multiple Vitamin (MULTI VITAMIN DAILY) TABS, Take by mouth., Disp: , Rfl:  .  valsartan (DIOVAN) 80 MG tablet, , Disp: , Rfl:   Past Medical History: No past medical history on file.  Tobacco Use: History  Smoking Status  . Not on file  Smokeless Tobacco  . Not on file    Labs: Recent Review Flowsheet Data    There is no flowsheet data to display.       Exercise Target Goals:    Exercise Program Goal: Individual exercise prescription set with THRR, safety & activity barriers. Participant demonstrates ability to understand and report RPE using BORG scale, to self-measure pulse accurately, and to acknowledge the importance of the exercise prescription.  Exercise Prescription Goal: Starting with aerobic activity 30 plus minutes a day, 3 days per week for initial exercise prescription. Provide home exercise prescription and guidelines that participant acknowledges understanding prior to discharge.  Activity Barriers & Risk Stratification:     Activity Barriers & Cardiac Risk Stratification  - 11/10/15 1321      Activity Barriers & Cardiac Risk Stratification   Activity Barriers Joint Problems;Deconditioning  Right shoulder/arm injury being followed by Dr KCaryl Comes   Still needs full evaluation. ICD  and other heart issues have taken precedent over this arm issue.   Cardiac Risk Stratification High      6 Minute Walk:     6 Minute Walk    Row Name 11/10/15 1636 11/11/15 0950       6 Minute Walk   Phase Initial  -    Distance 1200 feet 1200 feet    Walk Time 6 minutes 6 minutes    MPH 2.3 2.3    RPE 12 12    Symptoms No No    Resting HR 78 bpm 78 bpm    Max Ex. HR 116 bpm 116 bpm       Initial Exercise Prescription:     Initial Exercise Prescription - 11/10/15 1600      Date of Initial Exercise RX and Referring Provider   Date 11/10/15     Treadmill   MPH 2   Grade 0   Minutes 15     Bike   Level 1   Minutes 15     Recumbant Bike   Level 2   RPM 15   Minutes 15     NuStep   Level 2   Watts 30   Minutes 15     Recumbant Elliptical   Level 2  RPM 30   Minutes 15     REL-XR   Level 2   Watts 40   Minutes 15     T5 Nustep   Level 2   Watts 30   Minutes 15     Biostep-RELP   Level 2   Watts 30   Minutes 15     Prescription Details   Frequency (times per week) 3   Duration Progress to 45 minutes of aerobic exercise without signs/symptoms of physical distress     Intensity   THRR REST +  30   Ratings of Perceived Exertion 11-15     Progression   Progression Continue to progress workloads to maintain intensity without signs/symptoms of physical distress.     Resistance Training   Training Prescription Yes   Weight 3   Reps 10-15      Perform Capillary Blood Glucose checks as needed.  Exercise Prescription Changes:     Exercise Prescription Changes    Row Name 11/17/15 1600 12/09/15 1600 12/13/15 1600 01/06/16 1600 01/11/16 1400     Exercise Review   Progression _0      Response to Exercise    Blood Pressure (Admit)  -  - 134/80 134/80 138/80   Blood Pressure (Exercise)  -  - 142/78 142/78 130/70   Blood Pressure (Exit)  -  - 128/78 128/78 128/60   Heart Rate (Admit)  -  - 63 bpm 63 bpm 69 bpm   Heart Rate (Exercise)  -  - 89 bpm 89 bpm 95 bpm   Heart Rate (Exit)  -  - 66 bpm 66 bpm 94 bpm   Rating of Perceived Exertion (Exercise)  -  - _1 Symptoms _2    Comments Reviewed individualized exercise prescription and made increases per departmental policy. Exercise increases were discussed with the patient and they were able to perform the new work loads without issue (no signs or symptoms).  Reviewed individualized exercise prescription and made increases per departmental policy. Exercise increases were discussed with the patient and they were able to perform the new work loads without issue (no signs or symptoms).  Reviewed individualized exercise prescription and made increases per departmental policy. Exercise increases were discussed with the patient and they were able to perform the new work loads without issue (no signs or symptoms).  Reviewed individualized exercise prescription and made increases per departmental policy. Exercise increases were discussed with the patient and they were able to perform the new work loads without issue (no signs or symptoms).   -   Duration Progress to 30 minutes of continuous aerobic without signs/symptoms of physical distress Progress to 30 minutes of continuous aerobic without signs/symptoms of physical distress Progress to 30 minutes of continuous aerobic without signs/symptoms of physical distress Progress to 30 minutes of continuous aerobic without signs/symptoms of physical distress Progress to 45 minutes of aerobic exercise without signs/symptoms of physical distress   Intensity Rest + 30 Rest + 30 Rest + 30 Rest + 30 THRR New  40-80 % THRR 65-126     Progression   Progression Continue progressive overload as per policy  without signs/symptoms or physical distress. Continue progressive overload as per policy without signs/symptoms or physical distress. Continue progressive overload as per policy without signs/symptoms or physical distress. Continue progressive overload as per policy without signs/symptoms or physical distress. Continue progressive overload as per policy without signs/symptoms or physical distress.  Resistance Training   Training Prescription _0    Weight _1 Reps 10-15 10-15 10-15 10-15 10-15     Interval Training   Interval Training  -  - No No No     Treadmill   MPH _2 Grade 0 0 0 0 0   Minutes _3 Bike   Level _4 Minutes _5 Recumbant Bike   Level _6 RPM _7 Minutes _8 NuStep   Level _9 Watts _10 40   Minutes _11 Recumbant Elliptical   Level _12 RPM _13 Minutes _14 REL-XR   Level _15 Watts 40 40 40 40 80   Minutes _16 T5 Nustep   Level _17 Watts _18 40   Minutes _19 Biostep-RELP   Level _20 Watts _21 40   Minutes _22 Row Name 02/25/16 1100             Exercise Review   Progression No         Response to Exercise   Blood Pressure (Admit) 124/72       Blood Pressure (Exercise) 150/82       Blood Pressure (Exit) 122/68       Heart Rate (Admit) 79 bpm       Heart Rate (Exercise) 80 bpm       Heart Rate (Exit) 79 bpm       Rating of Perceived Exertion (Exercise) 13       Symptoms none       Duration Progress to 45 minutes of aerobic exercise without signs/symptoms of physical distress       Intensity THRR unchanged         Progression   Progression Continue to progress workloads to maintain intensity without signs/symptoms of physical distress.         Interval Training   Interval  Training No         REL-XR   Level 7       Watts 99       Minutes 15         Biostep-RELP   Level 3       Watts 44       Minutes 20          Exercise Comments:     Exercise Comments    Row Name 12/17/15 1239 01/11/16 1445 02/10/16 1401 02/25/16 1104 03/22/16 1524   Exercise Comments Henry Moreno has attended Heart Track sporadically but has made increases in exercise duration and intensity.  Goal will be to overcome barriers to more regular attendance Henry Moreno continues to attend Heart Track and is making improvement in exercise duration and intensity.  He remains cautious when exercising related to his past medical history.  He has experienced no untoward events during cardiac rehab. Henry Moreno has not attended since 01/13/16 Henry Moreno has just returned after missing several weeks.  Exercise prescirption was reviewed and he stated understanding of workloads and safety. Henry Moreno has not attended since 02/21/16.  Left message to check on status of return to program.   Row Name 04/06/16 1537           Exercise Comments Henry Moreno has not attended since 02/21/16.          Discharge Exercise Prescription (Final Exercise Prescription Changes):     Exercise Prescription Changes - 02/25/16 1100      Exercise Review   Progression No     Response to Exercise   Blood Pressure (Admit) 124/72   Blood Pressure (Exercise) 150/82   Blood Pressure (Exit) 122/68   Heart Rate (Admit) 79 bpm   Heart Rate (Exercise) 80 bpm   Heart Rate (Exit) 79 bpm   Rating of Perceived Exertion (Exercise) 13   Symptoms none   Duration Progress to 45 minutes of aerobic exercise without signs/symptoms of physical distress   Intensity THRR unchanged     Progression   Progression Continue to progress workloads to maintain intensity without signs/symptoms of physical distress.     Interval Training   Interval Training No     REL-XR   Level 7   Watts 99   Minutes 15     Biostep-RELP   Level 3   Watts 44    Minutes 20      Nutrition:  Target Goals: Understanding of nutrition guidelines, daily intake of sodium <1541m, cholesterol <2032m calories 30% from fat and 7% or less from saturated fats, daily to have 5 or more servings of fruits and vegetables.  Biometrics:     Pre Biometrics - 11/10/15 1640      Pre Biometrics   Height _0  (1.702 m)   Weight 227 lb (103 kg)   Waist Circumference 47 inches   Hip Circumference 48.25 inches   Waist to Hip Ratio 0.97 %   BMI (Calculated) 35.6       Nutrition Therapy Plan and Nutrition Goals:     Nutrition Therapy & Goals - 11/10/15 1300      Intervention Plan   Intervention Prescribe, educate and counsel regarding individualized specific dietary modifications aiming towards targeted core components such as weight, hypertension, lipid management, diabetes, heart failure and other comorbidities.;Nutrition handout(s) given to patient.   Expected Outcomes Short Term Goal: Understand basic principles of dietary content, such as calories, fat, sodium, cholesterol and nutrients.;Short Term Goal: A plan has been developed with personal nutrition goals set during dietitian appointment.;Long Term Goal: Adherence to prescribed nutrition plan.      Nutrition Discharge: Rate Your Plate Scores:     Nutrition Assessments - 12/03/15 1317      Rate Your Plate Scores   Pre Score 68   Pre Score % 76 %      Nutrition Goals Re-Evaluation:   Psychosocial: Target Goals: Acknowledge presence or absence of depression, maximize coping skills, provide positive support system. Participant is able to verbalize types and ability to use techniques and skills needed for reducing stress and depression.  Initial Review & Psychosocial Screening:     Initial Psych Review & Screening - 11/11/15 0952      Initial Review   Current issues with Current Stress Concerns   Source of Stress Concerns Transportation;Chronic Illness     Family Dynamics  Good  Support System? Yes     Screening Interventions   Interventions Encouraged to exercise      Quality of Life Scores:     Quality of Life - 11/10/15 1511      Quality of Life Scores   Health/Function Pre 19.87 %   Socioeconomic Pre 22.93 %   Psych/Spiritual Pre 23.86 %   Family Pre 30 %   GLOBAL Pre 22.81 %      PHQ-9: Recent Review Flowsheet Data    Depression screen [facility] 2/9 11/10/2015   Decreased Interest 0   Down, Depressed, Hopeless 0   PHQ - 2 Score 0   Altered sleeping 0   Tired, decreased energy 3   Change in appetite 0   Feeling bad or failure about yourself  0   Trouble concentrating 0   Moving slowly or fidgety/restless 0   Suicidal thoughts 0   PHQ-9 Score 3   Difficult doing work/chores Somewhat difficult      Psychosocial Evaluation and Intervention:     Psychosocial Evaluation - 01/12/16 1644      Psychosocial Evaluation & Interventions   Comments Counselor met with Henry Moreno for initial psychosocial evaluation with him reporting a strong support system with a spouse of 24 years and active involvement in his local church.  He has a pacemaker; sleep apnea and recently had a shoulder injury due to a fall.  He denies a history of depression or anxiety and reports he is generally in a positive mood.  He was to have another sleep study recently although he is currently on a CPAP.  Henry Moreno admits to multiple stressors in his life including his health and his job.  Furthermore, both his mother and mother in law have dementia and this contributes to Henry Moreno stress level as well.  He has goals to increase his education, lose weight and increase his strength and stamina.        Psychosocial Re-Evaluation:     Psychosocial Re-Evaluation    [place] Name 01/12/16 1709             Psychosocial Re-Evaluation   Comments Follow up with Henry Moreno today reporting sleep study resulted in a new CPAP machine that is helping with more refreshing sleep at  night.  He also reports the exercise is helping him feel stronger and more energetic as well.  When following with Henry Moreno about his ongoing stressors; he reports they remain the same, but he feels he is better equipped to coped with them at this time.  Counselor commended Henry Moreno for his progress made  and commitment to continue exercising on a regular basis.            Vocational Rehabilitation: Provide vocational rehab assistance to qualifying candidates.   Vocational Rehab Evaluation & Intervention:   Education: Education Goals: Education classes will be provided on a weekly basis, covering required topics. Participant will state understanding/return demonstration of topics presented.  Learning Barriers/Preferences:     Learning Barriers/Preferences - 11/10/15 1323      Learning Barriers/Preferences   Learning Barriers None   Learning Preferences None      Education Topics: General Nutrition Guidelines/Fats and Fiber: -Group instruction provided by verbal, written material, models and posters to present the general guidelines for heart healthy nutrition. Gives an explanation and review of dietary fats and fiber.   Controlling Sodium/Reading Food Labels: -Group verbal and written material supporting the discussion of sodium use in heart  healthy nutrition. Review and explanation with models, verbal and written materials for utilization of the food label.   Exercise Physiology & Risk Factors: - Group verbal and written instruction with models to review the exercise physiology of the cardiovascular system and associated critical values. Details cardiovascular disease risk factors and the goals associated with each risk factor.   Aerobic Exercise & Resistance Training: - Gives group verbal and written discussion on the health impact of inactivity. On the components of aerobic and resistive training programs and the benefits of this training and how to safely progress  through these programs.   Flexibility, Balance, General Exercise Guidelines: - Provides group verbal and written instruction on the benefits of flexibility and balance training programs. Provides general exercise guidelines with specific guidelines to those with heart or lung disease. Demonstration and skill practice provided.   Stress Management: - Provides group verbal and written instruction about the health risks of elevated stress, cause of high stress, and healthy ways to reduce stress.   Depression: - Provides group verbal and written instruction on the correlation between heart/lung disease and depressed mood, treatment options, and the stigmas associated with seeking treatment.   Anatomy & Physiology of the Heart: - Group verbal and written instruction and models provide basic cardiac anatomy and physiology, with the coronary electrical and arterial systems. Review of: AMI, Angina, Valve disease, Heart Failure, Cardiac Arrhythmia, Pacemakers, and the ICD. Flowsheet Row Cardiac Rehab from 02/21/2016 in [facility] Cardiac and Pulmonary Rehab  Date  02/21/16  Educator  DW  Instruction Review Code  2- meets goals/outcomes      Cardiac Procedures: - Group verbal and written instruction and models to describe the testing methods done to diagnose heart disease. Reviews the outcomes of the test results. Describes the treatment choices: Medical Management, Angioplasty, or Coronary Bypass Surgery. Flowsheet Row Cardiac Rehab from 02/21/2016 in [facility] Cardiac and Pulmonary Rehab  Date  01/03/16  Educator  SB  Instruction Review Code  2- meets goals/outcomes      Cardiac Medications: - Group verbal and written instruction to review commonly prescribed medications for heart disease. Reviews the medication, class of the drug, and side effects. Includes the steps to properly store meds and maintain the prescription regimen. Flowsheet Row Cardiac Rehab from 02/21/2016 in [facility] Cardiac and Pulmonary  Rehab  Date  01/12/16  Educator  DW  Instruction Review Code  2- meets goals/outcomes      Go Sex-Intimacy & Heart Disease, Get SMART - Goal Setting: - Group verbal and written instruction through game format to discuss heart disease and the return to sexual intimacy. Provides group verbal and written material to discuss and apply goal setting through the application of the S.M.A.R.T. Method. Flowsheet Row Cardiac Rehab from 02/21/2016 in [facility] Cardiac and Pulmonary Rehab  Date  01/03/16  Educator  SB  Instruction Review Code  2- meets goals/outcomes      Other Matters of the Heart: - Provides group verbal, written materials and models to describe Heart Failure, Angina, Valve Disease, and Diabetes in the realm of heart disease. Includes description of the disease process and treatment options available to the cardiac patient. Flowsheet Row Cardiac Rehab from 02/21/2016 in [facility] Cardiac and Pulmonary Rehab  Date  12/13/15  Educator  SB  Instruction Review Code  2- meets goals/outcomes      Exercise & Equipment Safety: - Individual verbal instruction and demonstration of equipment use and safety with use of the equipment. Flowsheet Row Cardiac Rehab from  02/21/2016 in [facility] Cardiac and Pulmonary Rehab  Date  11/10/15  Educator  Sb  Instruction Review Code  2- meets goals/outcomes      Infection Prevention: - Provides verbal and written material to individual with discussion of infection control including proper hand washing and proper equipment cleaning during exercise session. Flowsheet Row Cardiac Rehab from 02/21/2016 in [facility] Cardiac and Pulmonary Rehab  Date  11/10/15  Educator  Sb  Instruction Review Code  2- meets goals/outcomes      Falls Prevention: - Provides verbal and written material to individual with discussion of falls prevention and safety. Flowsheet Row Cardiac Rehab from 02/21/2016 in [facility] Cardiac and Pulmonary Rehab  Date  11/10/15  Educator  SB  Instruction  Review Code  2- meets goals/outcomes      Diabetes: - Individual verbal and written instruction to review signs/symptoms of diabetes, desired ranges of glucose level fasting, after meals and with exercise. Advice that pre and post exercise glucose checks will be done for 3 sessions at entry of program.    Knowledge Questionnaire Score:     Knowledge Questionnaire Score - 11/10/15 1323      Knowledge Questionnaire Score   Pre Score 24/28      Core Components/Risk Factors/Patient Goals at Admission:     Personal Goals and Risk Factors at Admission - 11/11/15 0951      Core Components/Risk Factors/Patient Goals on Admission    Weight Management Yes;Obesity   Intervention Weight Management: Develop a combined nutrition and exercise program designed to reach desired caloric intake, while maintaining appropriate intake of nutrient and fiber, sodium and fats, and appropriate energy expenditure required for the weight goal.;Weight Management: Provide education and appropriate resources to help participant work on and attain dietary goals.;Weight Management/Obesity: Establish reasonable short term and long term weight goals.;Obesity: Provide education and appropriate resources to help participant work on and attain dietary goals.   Admit Weight 220 lb (99.8 kg)   Goal Weight: Short Term 216 lb (98 kg)   Goal Weight: Long Term 174 lb (78.9 kg)   Expected Outcomes Short Term: Continue to assess and modify interventions until short term weight is achieved.;Long Term: Adherence to nutrition and physical activity/exercise program aimed toward attainment of established weight goal.   Sedentary Yes   Intervention Provide advice, education, support and counseling about physical activity/exercise needs.;Develop an individualized exercise prescription for aerobic and resistive training based on initial evaluation findings, risk stratification, comorbidities and participant's personal goals.   Expected  Outcomes Achievement of increased cardiorespiratory fitness and enhanced flexibility, muscular endurance and strength shown through measurements of functional capaciy and personal statement of participant.   Hypertension Yes   Intervention Provide education on lifestyle modifcations including regular physical activity/exercise, weight management, moderate sodium restriction and increased consumption of fresh fruit, vegetables, and low fat dairy, alcohol moderation, and smoking cessation.;Monitor prescription use compliance.   Expected Outcomes Short Term: Continued assessment and intervention until BP is < 140/71m HG in hypertensive participants. < 130/882mHG in hypertensive participants with diabetes, heart failure or chronic kidney disease.;Long Term: Maintenance of blood pressure at goal levels.   Lipids Yes   Intervention Provide education and support for participant on nutrition & aerobic/resistive exercise along with prescribed medications to achieve LDL <7043mHDL >65m84m Expected Outcomes Short Term: Participant states understanding of desired cholesterol values and is compliant with medications prescribed. Participant is following exercise prescription and nutrition guidelines.;Long Term: Cholesterol controlled with medications as prescribed, with individualized exercise  RX and with personalized nutrition plan. Value goals: LDL < 64m, HDL > 40 mg.   Personal Goal Other Yes   Personal Goal Improve my quality of life   Intervention Provide nutrition and exercise guidelines as with other goals   Expected Outcomes Increase in quality of life as verbalized by more energy to go home and spend time with my family.      Core Components/Risk Factors/Patient Goals Review:      Goals and Risk Factor Review    Row Name 12/08/15 1722             Core Components/Risk Factors/Patient Goals Review   Personal Goals Review Weight Management/Obesity       Review I made a dietician appt for [place]  He has committed to writing down what he eats or putting it on his phone program. JMerry Proudwill also track his steps. He will try to walk 10 minutes at work since he says exercise in the past has helped him lose weight.        Expected Outcomes Lose weight and eat healthier.           Core Components/Risk Factors/Patient Goals at Discharge (Final Review):      Goals and Risk Factor Review - 12/08/15 1722      Core Components/Risk Factors/Patient Goals Review   Personal Goals Review Weight Management/Obesity   Review I made a dietician appt for [place] He has committed to writing down what he eats or putting it on his phone program. JMerry Proudwill also track his steps. He will try to walk 10 minutes at work since he says exercise in the past has helped him lose weight.    Expected Outcomes Lose weight and eat healthier.       ITP Comments:     ITP Comments    Row Name 11/10/15 1249 11/18/15 1516 12/08/15 1720 12/19/15 1026 12/19/15 1027   ITP Comments Medical review completed   Initial ITP continue with ITP 30 Day Review. Continue with the ITP.  New to program.  I made a dietician appt for JMerry Moreno He has committed to writing down what he eats or putting it on his phone program. JMerry Proudwill also track his steps. He will try to walk 10 minutes at work since he says exercise in the past has helped him lose weight.  30 Day review. Continue with ITP 3 visits all mid April   Row Name 01/16/16 1050 02/10/16 1400 02/10/16 1452 02/16/16 0939 03/07/16 1621   ITP Comments 30 day review. Continue with ITP. 4 visits since last review Henry Moreno not attended since 01/13/16 last session in cardiac rehab was 01/13/16 30 day review.  Continue with ITP  Last visit 01/13/2016 Henry Moreno one session on 6/26.  He has not been back since that date and last visit prior was 01/13/16.    RRossName 03/22/16 1525 04/12/16 0838         ITP Comments Henry MHemmerichhas not attended since 02/21/16.  Left message to check on status  of return to program. 30 day review. Continue with ITP unless changes noted by Medical Director at signature of review.         Comments: Discharge ITP

## 2017-09-30 DIAGNOSIS — J329 Chronic sinusitis, unspecified: Secondary | ICD-10-CM | POA: Insufficient documentation

## 2017-11-12 ENCOUNTER — Other Ambulatory Visit: Payer: Self-pay

## 2017-11-12 ENCOUNTER — Inpatient Hospital Stay (HOSPITAL_COMMUNITY)
Admission: EM | Admit: 2017-11-12 | Discharge: 2017-11-15 | DRG: 286 | Disposition: A | Discharge status: Home / Self Care | Payer: 59 | Attending: Cardiology | Admitting: Cardiology

## 2017-11-12 ENCOUNTER — Encounter (HOSPITAL_COMMUNITY): Payer: Self-pay | Admitting: Emergency Medicine

## 2017-11-12 ENCOUNTER — Emergency Department (HOSPITAL_COMMUNITY): Payer: 59

## 2017-11-12 DIAGNOSIS — I340 Nonrheumatic mitral (valve) insufficiency: Secondary | ICD-10-CM | POA: Diagnosis not present

## 2017-11-12 DIAGNOSIS — Z95810 Presence of automatic (implantable) cardiac defibrillator: Secondary | ICD-10-CM

## 2017-11-12 DIAGNOSIS — I110 Hypertensive heart disease with heart failure: Secondary | ICD-10-CM | POA: Diagnosis present

## 2017-11-12 DIAGNOSIS — I5023 Acute on chronic systolic (congestive) heart failure: Secondary | ICD-10-CM

## 2017-11-12 DIAGNOSIS — Z4502 Encounter for adjustment and management of automatic implantable cardiac defibrillator: Secondary | ICD-10-CM

## 2017-11-12 DIAGNOSIS — I2510 Atherosclerotic heart disease of native coronary artery without angina pectoris: Secondary | ICD-10-CM | POA: Diagnosis present

## 2017-11-12 DIAGNOSIS — G4733 Obstructive sleep apnea (adult) (pediatric): Secondary | ICD-10-CM | POA: Diagnosis present

## 2017-11-12 DIAGNOSIS — I480 Paroxysmal atrial fibrillation: Secondary | ICD-10-CM | POA: Diagnosis present

## 2017-11-12 DIAGNOSIS — I428 Other cardiomyopathies: Secondary | ICD-10-CM | POA: Diagnosis present

## 2017-11-12 DIAGNOSIS — Z888 Allergy status to other drugs, medicaments and biological substances status: Secondary | ICD-10-CM

## 2017-11-12 DIAGNOSIS — I4901 Ventricular fibrillation: Secondary | ICD-10-CM | POA: Diagnosis present

## 2017-11-12 DIAGNOSIS — Z79899 Other long term (current) drug therapy: Secondary | ICD-10-CM | POA: Diagnosis not present

## 2017-11-12 DIAGNOSIS — E785 Hyperlipidemia, unspecified: Secondary | ICD-10-CM | POA: Diagnosis present

## 2017-11-12 DIAGNOSIS — I493 Ventricular premature depolarization: Secondary | ICD-10-CM | POA: Diagnosis present

## 2017-11-12 DIAGNOSIS — Z23 Encounter for immunization: Secondary | ICD-10-CM

## 2017-11-12 DIAGNOSIS — I462 Cardiac arrest due to underlying cardiac condition: Secondary | ICD-10-CM | POA: Diagnosis present

## 2017-11-12 DIAGNOSIS — Z886 Allergy status to analgesic agent status: Secondary | ICD-10-CM

## 2017-11-12 DIAGNOSIS — G8929 Other chronic pain: Secondary | ICD-10-CM | POA: Diagnosis present

## 2017-11-12 DIAGNOSIS — Z7901 Long term (current) use of anticoagulants: Secondary | ICD-10-CM | POA: Diagnosis not present

## 2017-11-12 DIAGNOSIS — I5043 Acute on chronic combined systolic (congestive) and diastolic (congestive) heart failure: Secondary | ICD-10-CM | POA: Diagnosis present

## 2017-11-12 DIAGNOSIS — I472 Ventricular tachycardia, unspecified: Secondary | ICD-10-CM

## 2017-11-12 DIAGNOSIS — E875 Hyperkalemia: Secondary | ICD-10-CM | POA: Diagnosis present

## 2017-11-12 HISTORY — DX: Presence of automatic (implantable) cardiac defibrillator: Z95.810

## 2017-11-12 HISTORY — DX: Cardiac arrest, cause unspecified: I46.9

## 2017-11-12 HISTORY — DX: Other chronic pain: G89.29

## 2017-11-12 HISTORY — DX: Unspecified atrial fibrillation: I48.91

## 2017-11-12 HISTORY — DX: Essential (primary) hypertension: I10

## 2017-11-12 HISTORY — DX: Other cardiomyopathies: I42.8

## 2017-11-12 LAB — BASIC METABOLIC PANEL
Anion gap: 12 (ref 5–15)
BUN: 17 mg/dL (ref 6–20)
CHLORIDE: 103 mmol/L (ref 101–111)
CO2: 21 mmol/L — AB (ref 22–32)
Calcium: 8.7 mg/dL — ABNORMAL LOW (ref 8.9–10.3)
Creatinine, Ser: 1.25 mg/dL — ABNORMAL HIGH (ref 0.61–1.24)
GFR calc non Af Amer: 59 mL/min — ABNORMAL LOW (ref >60)
GLUCOSE: 156 mg/dL — AB (ref 65–99)
Potassium: 5.6 mmol/L — ABNORMAL HIGH (ref 3.5–5.1)
Sodium: 136 mmol/L (ref 135–145)

## 2017-11-12 LAB — CBC WITH DIFFERENTIAL/PLATELET
Basophils Absolute: 0 10*3/uL (ref 0.0–0.1)
Basophils Relative: 0 %
Eosinophils Absolute: 0.2 10*3/uL (ref 0.0–0.7)
Eosinophils Relative: 2 %
HEMATOCRIT: 42.1 % (ref 39.0–52.0)
HEMOGLOBIN: 14.1 g/dL (ref 13.0–17.0)
LYMPHS ABS: 1.1 10*3/uL (ref 0.7–4.0)
LYMPHS PCT: 8 %
MCH: 33.7 pg (ref 26.0–34.0)
MCHC: 33.5 g/dL (ref 30.0–36.0)
MCV: 100.7 fL — AB (ref 78.0–100.0)
MONO ABS: 0.3 10*3/uL (ref 0.1–1.0)
MONOS PCT: 2 %
NEUTROS ABS: 11.4 10*3/uL — AB (ref 1.7–7.7)
NEUTROS PCT: 88 %
Platelets: 211 10*3/uL (ref 150–400)
RBC: 4.18 MIL/uL — ABNORMAL LOW (ref 4.22–5.81)
RDW: 13.6 % (ref 11.5–15.5)
WBC: 13 10*3/uL — ABNORMAL HIGH (ref 4.0–10.5)

## 2017-11-12 LAB — I-STAT TROPONIN, ED: Troponin i, poc: 0.13 ng/mL (ref 0.00–0.08)

## 2017-11-12 LAB — MAGNESIUM: Magnesium: 2.3 mg/dL (ref 1.7–2.4)

## 2017-11-12 LAB — TROPONIN I: TROPONIN I: 1.13 ng/mL — AB (ref <0.03)

## 2017-11-12 LAB — POTASSIUM: POTASSIUM: 4.2 mmol/L (ref 3.5–5.1)

## 2017-11-12 MED ORDER — ONDANSETRON HCL 4 MG/2ML IJ SOLN: 4.0000 mg | Freq: Four times a day (QID) | INTRAMUSCULAR | Status: DC | PRN

## 2017-11-12 MED ORDER — LORATADINE 10 MG PO TABS
10.0000 mg | ORAL_TABLET | Freq: Every day | ORAL | Status: DC
  Administered 2017-11-13 – 2017-11-15 (×3): 10 mg via ORAL
  Filled 2017-11-12 (×3): qty 1

## 2017-11-12 MED ORDER — NITROGLYCERIN 0.4 MG SL SUBL: 0.4000 mg | SUBLINGUAL_TABLET | SUBLINGUAL | Status: DC | PRN

## 2017-11-12 MED ORDER — ENOXAPARIN SODIUM 40 MG/0.4ML ~~LOC~~ SOLN
40.0000 mg | SUBCUTANEOUS | Status: DC
  Administered 2017-11-12 – 2017-11-14 (×3): 40 mg via SUBCUTANEOUS
  Filled 2017-11-12 (×3): qty 0.4

## 2017-11-12 MED ORDER — ACETAMINOPHEN 325 MG PO TABS: 650.0000 mg | ORAL_TABLET | ORAL | Status: DC | PRN

## 2017-11-12 MED ORDER — MAGNESIUM 200 MG PO TABS: 200.0000 mg | ORAL_TABLET | Freq: Every day | ORAL | Status: DC

## 2017-11-12 MED ORDER — ACETAMINOPHEN 500 MG PO TABS
1000.0000 mg | ORAL_TABLET | Freq: Two times a day (BID) | ORAL | Status: DC
  Administered 2017-11-12 – 2017-11-15 (×6): 1000 mg via ORAL
  Filled 2017-11-12 (×6): qty 2

## 2017-11-12 MED ORDER — AMIODARONE HCL IN DEXTROSE 360-4.14 MG/200ML-% IV SOLN
60.0000 mg/h | INTRAVENOUS | Status: AC
  Administered 2017-11-12 (×2): 60 mg/h via INTRAVENOUS
  Filled 2017-11-12 (×2): qty 200

## 2017-11-12 MED ORDER — FUROSEMIDE 10 MG/ML IJ SOLN
40.0000 mg | Freq: Every day | INTRAMUSCULAR | Status: DC
  Administered 2017-11-13 – 2017-11-14 (×2): 40 mg via INTRAVENOUS
  Filled 2017-11-12 (×3): qty 4

## 2017-11-12 MED ORDER — CARVEDILOL 12.5 MG PO TABS: 12.5000 mg | ORAL_TABLET | Freq: Two times a day (BID) | ORAL | Status: DC

## 2017-11-12 MED ORDER — ORAL CARE MOUTH RINSE
15.0000 mL | Freq: Two times a day (BID) | OROMUCOSAL | Status: DC
  Administered 2017-11-12: 15 mL via OROMUCOSAL

## 2017-11-12 MED ORDER — AMIODARONE HCL IN DEXTROSE 360-4.14 MG/200ML-% IV SOLN
30.0000 mg/h | INTRAVENOUS | Status: DC
  Administered 2017-11-13 – 2017-11-14 (×3): 30 mg/h via INTRAVENOUS
  Filled 2017-11-12 (×4): qty 200

## 2017-11-12 MED ORDER — IRBESARTAN 75 MG PO TABS
75.0000 mg | ORAL_TABLET | Freq: Every day | ORAL | Status: DC
  Administered 2017-11-13 – 2017-11-14 (×2): 75 mg via ORAL
  Filled 2017-11-12 (×3): qty 1

## 2017-11-12 MED ORDER — PNEUMOCOCCAL VAC POLYVALENT 25 MCG/0.5ML IJ INJ
0.5000 mL | INJECTION | INTRAMUSCULAR | Status: AC
  Administered 2017-11-13: 0.5 mL via INTRAMUSCULAR
  Filled 2017-11-12: qty 0.5

## 2017-11-12 NOTE — H&P (Signed)
Pt seen and examined.  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 Fib  Anticoagulation with Eliquis  Ibuprofen intolerance     The patient has had ventricular tachycardia.  It was below detection for 2 hours and then gave rise in a moment to ventricular fibrillation.  He was shocked appropriately.  The mechanism of his ventricular tachycardia is not clear.  The patient had chest pain during the ventricular tachycardia suggesting at least demand ischemia might have contributed to his ventricular fibrillation.  I reviewed with Dr. Glennon Mac at [facility] is sought regarding evaluation.  We are both in agreement that catheterization is indicated.  We will undertake an echocardiogram to look at left ventricular function.  His PVC burden has been significantly reduced based on interrogation of his device; however, at least in the wake of his shock today and his respiratory insufficiency it is markedly different at a rate of 15% or so.  Again this may be simply an acute perturbation.  I have reviewed this with Dr. Glennon Mac and he will follow this up.  For now,  IV furosemide  Recheck K  we will plan to hold his Eliquis.  Catheterization on Wednesday following washout.  Hold ASA for right now with Ibuprofen intolerance   Echocardiogram in a.m.  Amiodarone IV overnight within plans to resume at his prior dose given the relative infrequency of his ventricular tachycardia  Reprogramming of his device to  provide antitachycardia pacing from 160-200 bpm

## 2017-11-12 NOTE — ED Notes (Signed)
Patient taken off NRB mask and placed on 2L O2 [place].

## 2017-11-12 NOTE — ED Notes (Signed)
Writer notified EDP Floyd of abnormal I stat trop result .[place]

## 2017-11-12 NOTE — H&P (Addendum)
Cardiology Consultation:   Patient ID: Henry Moreno; [id number]; 04-20-1951   Admit date: 11/12/2017 Date of Consult: 11/12/2017  Primary Care Provider: Patient, No Pcp Per Primary Cardiologist:  Dr. Garlon Hatchet Primary Electrophysiologist: Dr. [name]  Patient Profile:   Henry Moreno is a 67 y.o. male with a hx of HTN, HLD, paroxysmal atrial fibrillation on Eliquis, history of combined diastolic and systolic heart failure, nonischemic cardiomyopathy, V. fib arrest on 01/25/2010 status post Medtronic biventricular ICD device implantation on 01/2010, status post generator change out on 10/11/2015 who is being seen today for the evaluation of VT status post shock.  Last RP note I see with Dr. Glennon Mac 11/16/16 nonischemic cardiomyopathy, left bundle branch block, NYHA Class II-III congestive heart failure, left ventricular ejection fraction 25%. He underwent CRT-D implantation 01/31/2010. He has had difficulty with diaphragmatic pacing requiring adjustment of his LV lead output. Post CRT his EF improved to 45%. Device interrogation showed afib (asymptomatic) and NOAC was started in 2013. In 01/2014, he was noted to have a decrease in BiV pacing to 88% due to atrial fibrillation/tachycardia. Magnesium was started and coreg dose increased. BiV pacing percentage was subsequently 96%.   The patient had an ICD shock 04/2015 for PMVT/VF. This was in the setting of missing doses of carvedilol. Medication compliance was stressed to the patient. Battery noted to be at [facility] 07/2015 and he is s/p generator changeout 09/2015. On April/2017 EP appointment, BiV pacing decreased to 85% due to PVCs, the patient was initiated on amiodarone.   History of Present Illness:   Mr. [name] is a 67 year old male history as stated above who presents to [facility] emergency department on 11/12/2017 status post out of hospital AICD shock.   Mr. [name] was in his usual state of health until this morning after  arriving to work.  He started having mild, midsternal chest pain with no associated symptoms.  EMS was called.  On arrival per EMS report, he lost his pulses, there is mention of versed given it seems in preparation for cardioversion, though the patient went into VF arrest and received CPR followed by the patient's ICD shock back to sinus rhythm.  He was transported to [facility] ED in stable condition.  He was started on amiodarone drip  His home medications include Eliquis 5 mg, carvedilol, amiodarone, Eliquis.   LABS K+ 5.6 (repeat ordered) Mag 2.3 BUN/Creat 17/1.25 WBC 13.0 H/H 14/42 Plts 211  cxr w/NAD        Past Medical History:  Diagnosis Date  . Atrial fibrillation ([place])   . Cardiac arrest ([place])   . Chronic pain    neck   . Hypertension   . ICD (implantable cardioverter-defibrillator) in place   . NICM (nonischemic cardiomyopathy) ([place])     History reviewed. No pertinent surgical history.   Prior to Admission medications   Medication Sig Start Date End Date Taking? Authorizing Provider  acetaminophen (TYLENOL) 500 MG tablet Take 1,000 mg by mouth 2 (two) times daily.    [provider]  apixaban (ELIQUIS) 5 MG TABS tablet Take by mouth. 10/14/15   [provider]  carvedilol (COREG) 25 MG tablet  11/19/14   [provider]  cetirizine (ZYRTEC) 10 MG tablet Take by mouth. 02/15/10   [provider]  Cholecalciferol (VITAMIN D3) 3000 units TABS Take 1 capsule by mouth 2 (two) times daily. Taking a  2000mg  dose 02/15/10   [provider]  Magnesium 200 MG TABS Take by mouth.  [provider]  Multiple Vitamin (MULTI VITAMIN DAILY) TABS Take by mouth. 02/15/10   [provider]  valsartan (DIOVAN) 80 MG tablet  01/07/15   [provider]    Inpatient Medications: Scheduled Meds: . acetaminophen  1,000 mg Oral BID  . [START ON 11/13/2017] carvedilol  12.5 mg Oral BID WC  . enoxaparin (LOVENOX)  injection  40 mg Subcutaneous Q24H  . irbesartan  75 mg Oral Daily  . loratadine  10 mg Oral Daily  . Magnesium  200 mg Oral QHS   Continuous Infusions: . amiodarone 60 mg/hr (11/12/17 1456)   Followed by  . amiodarone     PRN Meds:   Allergies:    Allergies  Allergen Reactions  . Ace Inhibitors Cough  . Ibuprofen Other (See Comments)    Other Reaction: HIVES / RASH    Social History:   Social History   Socioeconomic History  . Marital status: Married    Spouse name: Not on file  . Number of children: Not on file  . Years of education: Not on file  . Highest education level: Not on file  Social Needs  . Financial resource strain: Not on file  . Food insecurity - worry: Not on file  . Food insecurity - inability: Not on file  . Transportation needs - medical: Not on file  . Transportation needs - non-medical: Not on file  Occupational History  . Not on file  Tobacco Use  . Smoking status: Never Smoker  . Smokeless tobacco: Never Used  Substance and Sexual Activity  . Alcohol use: No    Frequency: Never  . Drug use: No  . Sexual activity: Not on file  Other Topics Concern  . Not on file  Social History Narrative  . Not on file    Family History:   Family History  Adopted: Yes   Family Status:  No family status information on file.    ROS:  Please see the history of present illness.  All other ROS reviewed and negative.     Physical Exam/Data:   Vitals:   11/12/17 1600 11/12/17 1645 11/12/17 1700 11/12/17 1730  BP: 111/77 116/80 126/79 118/76  Pulse: 64 62 60 62  Resp: 20 (!) 24 19 (!) 23  Temp:      TempSrc:      SpO2: 100% 98% 100% 99%  Weight:       No intake or output data in the 24 hours ending 11/12/17 1829 Filed Weights   11/12/17 1517  Weight: 227 lb (103 kg)   Body mass index is 35.55 kg/m.  General:  Well nourished, well developed, in no acute distress HEENT: normal Lymph: no adenopathy Neck: +8 JVD Endocrine:  No  thryomegaly Vascular: No carotid bruits  Cardiac:  RRR; no murmurs, gallops or rubs Lungs:  CTA b/l, no wheezing, rhonchi or rales  Abd: soft, nontender  Ext: no edema Musculoskeletal:  No deformities Skin: warm and dry  Neuro:  No gross focal abnormalities noted Psych:  Normal affect   EKG:  The EKG was personally reviewed and demonstrates:   SR, V paced RBBB morphology Telemetry:  Telemetry was personally reviewed and demonstrates:   SR, frequent PVCs  Relevant CV Studies:  ECHO: 11/30/16 LV Ejection Fraction (%) 25 %  Right Ventricle Systolic Pressure (mmHg) 25 mmHg  Left Atrium Diameter (cm) 4.7 cm  LV End Diastolic Diameter (cm) 6.2 cm  LV End Systolic Diameter (cm) 5.4 cm  LV  Septum Wall Thickness (cm) 1.2 cm  LV Posterior Wall Thickness (cm) 1.2 cm  Tricuspid Valve Regurgitation Grade mild   Tricuspid Valve Regurgitation Max Velocity (m/s) 2.3 m/s  Mitral Valve Regurgitation Grade mild   Mitral Valve Stenosis Grade none   Aortic Valve Regurgitation Grade trivial   Aortic Valve Stenosis Grade none   SEVERE LV DYSFUNCTION (See above) WITH MILD LVH NORMAL RIGHT VENTRICULAR SYSTOLIC FUNCTION VALVULAR REGURGITATION: TRIVIAL AR, MILD MR, MILD TR NO VALVULAR STENOSIS    Laboratory Data:  Chemistry Recent Labs  Lab 11/12/17 1436  NA 136  K 5.6*  CL 103  CO2 21*  GLUCOSE 156*  BUN 17  CREATININE 1.25*  CALCIUM 8.7*  GFRNONAA 59*  GFRAA >60  ANIONGAP 12    Total Protein  Date Value Ref Range Status  08/18/2013 8.3 (H) 6.4 - 8.2 g/dL Final   Albumin  Date Value Ref Range Status  08/18/2013 4.2 3.4 - 5.0 g/dL Final   SGOT(AST)  Date Value Ref Range Status  08/18/2013 15 15 - 37 Unit/L Final   SGPT (ALT)  Date Value Ref Range Status  08/18/2013 22 12 - 78 U/L Final   Alkaline Phosphatase  Date Value Ref Range Status  08/18/2013 63 Unit/L Final    Comment:    45-117 NOTE: New Reference Range 07/18/13    Bilirubin,Total  Date Value Ref  Range Status  08/18/2013 0.7 0.2 - 1.0 mg/dL Final   Hematology Recent Labs  Lab 11/12/17 1436  WBC 13.0*  RBC 4.18*  HGB 14.1  HCT 42.1  MCV 100.7*  MCH 33.7  MCHC 33.5  RDW 13.6  PLT 211   Cardiac EnzymesNo results for input(s): TROPONINI in the last 168 hours.  Recent Labs  Lab 11/12/17 1516  TROPIPOC 0.13*    BNPNo results for input(s): BNP, PROBNP in the last 168 hours.  DDimer No results for input(s): DDIMER in the last 168 hours. TSH: No results found for: TSH Lipids:No results found for: CHOL, HDL, LDLCALC, LDLDIRECT, TRIG, CHOLHDL HgbA1c:No results found for: HGBA1C  Radiology/Studies:   Dg Chest Port 1 View Result Date: 11/12/2017 CLINICAL DATA:  67 year old male with chest pain for 1 day. EXAM: PORTABLE CHEST 1 VIEW COMPARISON:  None available. FINDINGS: Portable AP semi upright view at 1428 hours. Left chest 3 lead cardiac AICD. The patient is rotated to the left. Cardiac size is at the upper limits of normal to mildly enlarged. Other mediastinal contours are within normal limits. Visualized tracheal air column is within normal limits. Low lung volumes, but allowing for portable technique the lungs are clear. No pneumothorax. IMPRESSION: No acute cardiopulmonary abnormality. Electronically Signed   By: Genevie Ann M.D.   On: 11/12/2017 14:57    Assessment and Plan:   1. MMVT >> PMVT      ICD interrogation:      Battery and lead testing are good      Pt had just over 2 hours of MMVT rates 170's bpm in monitor zone followed by PMVT successful shocked to SR w/1 shock  Dr. Caryl Comes has discussed with the patient's EP, Dr. Glennon Mac Plan ICU admission Amiodarone gtt Hold Eliquis for plan of cath Wed Device was reprogrammed to include VT zone with ATP therapies/shock  2. Hx of NICM     Optivol and CXR do not suggest overt fluid OL     His O2 sats 88-91 on 3 L     Given prolonged VT, llikely in some acute HF  IV lasix today  3. Hyperkalemia     Repeat  4. No  known CAD     1st Trop abn 0.13     Likely demand given prolonged VT episodes/VF and shock  5. Paroxysmal AFib     CHA2DS2Vasc is 2, on Eliquis out patient     No AFib of late or now, will hold for plans of cath Wed      For questions or updates, please contact [place] Please consult [email or web address] for contact info under Cardiology/STEMI.   SignedKathyrn Drown NP-C [place] Pager: [phone] 11/12/2017 6:29 PM  Seen with RU PA  See my addendum separately

## 2017-11-12 NOTE — ED Provider Notes (Signed)
[facility] EMERGENCY DEPARTMENT Provider Note   CSN: [id number] Arrival date & time: 11/12/17  1426     History   Chief Complaint Chief Complaint  Patient presents with  . Vtach    HPI Henry Moreno is a 67 y.o. male.  67 yo M with a chief complaint of chest pain and weakness.  The patient apparently has been weak over the past couple months.  Started having some chest pain while at work and called 911.  On arrival the patient was in a very fast wide rhythm.  Per EMS he lost pulses and they gave him Versed and attempted to cardiovert him.  Patient then went into cardiac arrest and received some CPR followed by the patient's own defibrillator shocking him back into sinus rhythm.  The patient has been somewhat sleepy secondary to the Versed.  Further history obtained from the family.  Has had recurrent URIs off and on for the past couple months.  No significant events yesterday.  Denies any change in medications.   The history is provided by the patient and the EMS personnel.  Illness  This is a new problem. The current episode started less than 1 hour ago. The problem occurs constantly. The problem has not changed since onset.Associated symptoms include chest pain and shortness of breath. Pertinent negatives include no abdominal pain and no headaches. Nothing aggravates the symptoms. Nothing relieves the symptoms. He has tried nothing for the symptoms. The treatment provided no relief.    History reviewed. No pertinent past medical history.  Patient Active Problem List   Diagnosis Date Noted  . Chronic combined systolic and diastolic heart failure ([place]) 10/08/2015  . Blood glucose elevated 09/28/2014  . Long term current use of anticoagulant 08/25/2014  . Atrial fibrillation ([place]) 06/06/2012  . Automatic implantable cardioverter-defibrillator in situ 01/19/2012  . Dyslipidemia 05/26/2011  . Essential (primary) hypertension 05/26/2011  . Adiposity 05/26/2011   . Apnea, sleep 05/26/2011  . Ventricular fibrillation ([place]) 05/26/2011    History reviewed. No pertinent surgical history.     Home Medications    Prior to Admission medications   Medication Sig Start Date End Date Taking? Authorizing Provider  acetaminophen (TYLENOL) 500 MG tablet Take 1,000 mg by mouth 2 (two) times daily.    [provider]  apixaban (ELIQUIS) 5 MG TABS tablet Take by mouth. 10/14/15   [provider]  carvedilol (COREG) 25 MG tablet  11/19/14   [provider]  cetirizine (ZYRTEC) 10 MG tablet Take by mouth. 02/15/10   [provider]  Cholecalciferol (VITAMIN D3) 3000 units TABS Take 1 capsule by mouth 2 (two) times daily. Taking a  2000mg  dose 02/15/10   [provider]  Magnesium 200 MG TABS Take by mouth.    [provider]  Multiple Vitamin (MULTI VITAMIN DAILY) TABS Take by mouth. 02/15/10   [provider]  valsartan (DIOVAN) 80 MG tablet  01/07/15   [provider]    Family History No family history on file.  Social History Social History   Tobacco Use  . Smoking status: Never Smoker  . Smokeless tobacco: Never Used  Substance Use Topics  . Alcohol use: No    Frequency: Never  . Drug use: No     Allergies   Ace inhibitors and Ibuprofen   Review of Systems Review of Systems  Constitutional: Negative for chills and fever.  HENT: Negative for congestion and facial swelling.   Eyes: Negative for discharge  and visual disturbance.  Respiratory: Positive for shortness of breath.   Cardiovascular: Positive for chest pain. Negative for palpitations.  Gastrointestinal: Negative for abdominal pain, diarrhea and vomiting.  Musculoskeletal: Negative for arthralgias and myalgias.  Skin: Negative for color change and rash.  Neurological: Positive for weakness. Negative for tremors, syncope and headaches.  Psychiatric/Behavioral: Negative for confusion and dysphoric mood.      Physical Exam Updated Vital Signs BP 105/79   Pulse 64   Temp (!) 97.1 F (36.2 C) (Temporal)   Resp 19   Wt 103 kg (227 lb)   SpO2 100%   BMI 35.55 kg/m   Physical Exam  Constitutional: He is oriented to person, place, and time. He appears well-developed and well-nourished.  HENT:  Head: Normocephalic and atraumatic.  Eyes: EOM are normal. Pupils are equal, round, and reactive to light.  Neck: Normal range of motion. Neck supple. No JVD present.  Cardiovascular: Normal rate and regular rhythm. Exam reveals no gallop and no friction rub.  No murmur heard. Pulmonary/Chest: No respiratory distress. He has no wheezes.  Abdominal: He exhibits no distension and no mass. There is no tenderness. There is no rebound and no guarding.  obese  Musculoskeletal: Normal range of motion.  Neurological: He is alert and oriented to person, place, and time.  Skin: No rash noted. No pallor.  Psychiatric: He has a normal mood and affect. His behavior is normal.  Nursing note and vitals reviewed.    ED Treatments / Results  Labs (all labs ordered are listed, but only abnormal results are displayed) Labs Reviewed  CBC WITH DIFFERENTIAL/PLATELET - Abnormal; Notable for the following components:      Result Value   WBC 13.0 (*)    RBC 4.18 (*)    MCV 100.7 (*)    Neutro Abs 11.4 (*)    All other components within normal limits  BASIC METABOLIC PANEL - Abnormal; Notable for the following components:   Potassium 5.6 (*)    CO2 21 (*)    Glucose, Bld 156 (*)    Creatinine, Ser 1.25 (*)    Calcium 8.7 (*)    GFR calc non Af Amer 59 (*)    All other components within normal limits  I-STAT TROPONIN, ED - Abnormal; Notable for the following components:   Troponin i, poc 0.13 (*)    All other components within normal limits  MAGNESIUM    EKG  EKG Interpretation  Date/Time:  Monday November 12 2017 14:28:27 EDT Ventricular Rate:  73 PR Interval:    QRS Duration: 187 QT  Interval:  511 QTC Calculation: 564 R Axis:   -94 Text Interpretation:  Sinus rhythm Atrial premature complexes Right bundle branch block No significant change since last tracing Confirmed by Deno Etienne [phone]) on 11/12/2017 2:35:04 PM       Radiology Dg Chest Port 1 View  Result Date: 11/12/2017 CLINICAL DATA:  67 year old male with chest pain for 1 day. EXAM: PORTABLE CHEST 1 VIEW COMPARISON:  None available. FINDINGS: Portable AP semi upright view at 1428 hours. Left chest 3 lead cardiac AICD. The patient is rotated to the left. Cardiac size is at the upper limits of normal to mildly enlarged. Other mediastinal contours are within normal limits. Visualized tracheal air column is within normal limits. Low lung volumes, but allowing for portable technique the lungs are clear. No pneumothorax. IMPRESSION: No acute cardiopulmonary abnormality. Electronically Signed   By: Genevie Ann M.D.   On: 11/12/2017 14:57  Procedures Procedures (including critical care time) Procedure note: Ultrasound Guided Peripheral IV Ultrasound guided peripheral 1.88 inch angiocath IV placement performed by me. Indications: Nursing unable to place IV. Details: The antecubital fossa and upper arm were evaluated with a multifrequency linear probe. Patent brachial veins were noted. 1 attempt was made to cannulate a vein under realtime US guidance with successful cannulation of the vein and catheter placement. There is return of non-pulsatile dark red blood. The patient tolerated the procedure well without complications. Images archived electronically.  CPT codes: [phone] and [phone]  Medications Ordered in ED Medications  amiodarone (NEXTERONE PREMIX) 360-4.14 MG/200ML-% (1.8 mg/mL) IV infusion (60 mg/hr Intravenous New Bag/Given 11/12/17 1456)    Followed by  amiodarone (NEXTERONE PREMIX) 360-4.14 MG/200ML-% (1.8 mg/mL) IV infusion (not administered)     Initial Impression / Assessment and Plan / ED Course  I have  reviewed the triage vital signs and the nursing notes.  Pertinent labs & imaging results that were available during my care of the patient were reviewed by me and considered in my medical decision making (see chart for details).     67 yo M with a chief complaint of chest pain and weakness.  Patient subsequently was in a wide-complex rhythm and was cardioverted in the field going to lose pulses and had CPR performed for a short time before he was shocked by his internal defibrillator.  On arrival the patient is able to respond to simple questions.  No signs of heart failure.  Will obtain labs chest x-ray discussed with cards.  CRITICAL CARE Performed by: Cecilio Asper   Total critical care time: 35 minutes  Critical care time was exclusive of separately billable procedures and treating other patients.  Critical care was necessary to treat or prevent imminent or life-threatening deterioration.  Critical care was time spent personally by me on the following activities: development of treatment plan with patient and/or surrogate as well as nursing, discussions with consultants, evaluation of patient's response to treatment, examination of patient, obtaining history from patient or surrogate, ordering and performing treatments and interventions, ordering and review of laboratory studies, ordering and review of radiographic studies, pulse oximetry and re-evaluation of patient's condition.  The patients results and plan were reviewed and discussed.   Any x-rays performed were independently reviewed by myself.   Differential diagnosis were considered with the presenting HPI. The patients results and plan were reviewed and discussed.   Any x-rays performed were independently reviewed by myself.   Differential diagnosis were considered with the presenting HPI.  Medications  amiodarone (NEXTERONE PREMIX) 360-4.14 MG/200ML-% (1.8 mg/mL) IV infusion (60 mg/hr Intravenous New Bag/Given  11/12/17 2024)    Followed by  amiodarone (NEXTERONE PREMIX) 360-4.14 MG/200ML-% (1.8 mg/mL) IV infusion (30 mg/hr Intravenous Rate/Dose Verify 11/13/17 0600)  nitroGLYCERIN (NITROSTAT) SL tablet 0.4 mg (not administered)  ondansetron (ZOFRAN) injection 4 mg (not administered)  enoxaparin (LOVENOX) injection 40 mg (40 mg Subcutaneous Given 11/12/17 2137)  acetaminophen (TYLENOL) tablet 1,000 mg (1,000 mg Oral Given 11/12/17 2137)  irbesartan (AVAPRO) tablet 75 mg (not administered)  carvedilol (COREG) tablet 12.5 mg (not administered)  loratadine (CLARITIN) tablet 10 mg (not administered)  furosemide (LASIX) injection 40 mg (not administered)  pneumococcal 23 valent vaccine (PNU-IMMUNE) injection 0.5 mL (not administered)  MEDLINE mouth rinse (15 mLs Mouth Rinse Given 11/12/17 2200)    Vitals:   11/13/17 0350 11/13/17 0400 11/13/17 0500 11/13/17 0602  BP:  97/60 101/65 (!) 78/63  Pulse:  61 60 (!)  59  Resp:  (!) 22 20 (!) 24  Temp: 97.7 F (36.5 C)     TempSrc: Oral     SpO2:  92% 90% 92%  Weight:   108.3 kg (238 lb 12.1 oz)   Height:        Final diagnoses:  V-tach [facility])    Admission/ observation were discussed with the admitting physician, patient and/or family and they are comfortable with the plan.   Medications  amiodarone (NEXTERONE PREMIX) 360-4.14 MG/200ML-% (1.8 mg/mL) IV infusion (60 mg/hr Intravenous New Bag/Given 11/12/17 1456)    Followed by  amiodarone (NEXTERONE PREMIX) 360-4.14 MG/200ML-% (1.8 mg/mL) IV infusion (not administered)    Vitals:   11/12/17 1445 11/12/17 1500 11/12/17 1515 11/12/17 1517  BP:  100/72 105/79   Pulse:  69 64   Resp:  (!) 23 19   Temp:      TempSrc:      SpO2: 98% 99% 100%   Weight:    103 kg (227 lb)    Final diagnoses:  V-tach [facility])    Admission/ observation were discussed with the admitting physician, patient and/or family and they are comfortable with the plan.   Final Clinical Impressions(s) / ED Diagnoses   Final  diagnoses:  V-tach [facility])    ED Discharge Orders    None       Deno Etienne, DO 11/13/17 0700

## 2017-11-12 NOTE — ED Triage Notes (Signed)
Pt in from work via [employer] with initial call out for cp. EMS arrived and pt was diaphoretic, unresponsive and Vtach en route. Given 150mg  Amio, no response. Given 5mg  Versed en route. Cardioverted with 100J PTA. Reported 30 sec of  CPR and Vfib with pacer shock - has Medtronic device. Sats 74% on RA, placed on NRB. Arrives in [facility]

## 2017-11-13 ENCOUNTER — Inpatient Hospital Stay (HOSPITAL_COMMUNITY): Payer: 59

## 2017-11-13 DIAGNOSIS — I340 Nonrheumatic mitral (valve) insufficiency: Secondary | ICD-10-CM

## 2017-11-13 LAB — TROPONIN I
TROPONIN I: 1.6 ng/mL — AB (ref <0.03)
Troponin I: 1.6 ng/mL (ref <0.03)

## 2017-11-13 LAB — BASIC METABOLIC PANEL
Anion gap: 13 (ref 5–15)
BUN: 13 mg/dL (ref 6–20)
CHLORIDE: 105 mmol/L (ref 101–111)
CO2: 19 mmol/L — ABNORMAL LOW (ref 22–32)
CREATININE: 0.95 mg/dL (ref 0.61–1.24)
Calcium: 8.6 mg/dL — ABNORMAL LOW (ref 8.9–10.3)
Glucose, Bld: 107 mg/dL — ABNORMAL HIGH (ref 65–99)
Potassium: 4.2 mmol/L (ref 3.5–5.1)
SODIUM: 137 mmol/L (ref 135–145)

## 2017-11-13 LAB — MRSA PCR SCREENING: MRSA by PCR: NEGATIVE

## 2017-11-13 LAB — ECHOCARDIOGRAM COMPLETE
Height: 67 in
Weight: 3820.13 oz

## 2017-11-13 MED ORDER — ASPIRIN EC 81 MG PO TBEC
81.0000 mg | DELAYED_RELEASE_TABLET | Freq: Every day | ORAL | Status: DC
  Administered 2017-11-13 – 2017-11-14 (×2): 81 mg via ORAL
  Filled 2017-11-13 (×3): qty 1

## 2017-11-13 MED ORDER — CARVEDILOL 25 MG PO TABS
25.0000 mg | ORAL_TABLET | Freq: Two times a day (BID) | ORAL | Status: DC
  Administered 2017-11-13 – 2017-11-15 (×6): 25 mg via ORAL
  Filled 2017-11-13 (×6): qty 1

## 2017-11-13 MED ORDER — CARVEDILOL 25 MG PO TABS: 25.0000 mg | ORAL_TABLET | Freq: Two times a day (BID) | ORAL | Status: DC

## 2017-11-13 NOTE — Progress Notes (Signed)
  Echocardiogram 2D Echocardiogram has been performed.  Jannett Celestine 11/13/2017, 12:32 PM

## 2017-11-13 NOTE — Progress Notes (Signed)
CRITICAL VALUE ALERT  Critical Value:  Troponin=1.13  Date & Time Notied:  11/12/17 2220  Provider Notified: MD Charissa Bash  No new orders, Pt stable. No acute changes. RN will continue to monitor

## 2017-11-13 NOTE — Progress Notes (Addendum)
Set up pt cpap on pressure of 13 with med full faced mask and pt stated that it was a good fit and call when ready to be placed on so that myself or the RN knew O2 was bled into CPAP.

## 2017-11-13 NOTE — Plan of Care (Signed)
  Progressing Education: Knowledge of General Education information will improve 11/13/2017 0635 - Progressing by Arletta Bale, RN Health Behavior/Discharge Planning: Ability to manage health-related needs will improve 11/13/2017 0635 - Progressing by Arletta Bale, RN Clinical Measurements: Ability to maintain clinical measurements within normal limits will improve 11/13/2017 0635 - Progressing by Arletta Bale, RN Will remain free from infection 11/13/2017 0635 - Progressing by Arletta Bale, RN Diagnostic test results will improve 11/13/2017 0635 - Progressing by Arletta Bale, RN Respiratory complications will improve 11/13/2017 0635 - Progressing by Arletta Bale, RN Cardiovascular complication will be avoided 11/13/2017 0635 - Progressing by Arletta Bale, RN Activity: Risk for activity intolerance will decrease 11/13/2017 0635 - Progressing by Arletta Bale, RN Nutrition: Adequate nutrition will be maintained 11/13/2017 0635 - Progressing by Arletta Bale, RN Coping: Level of anxiety will decrease 11/13/2017 0635 - Progressing by Arletta Bale, RN Elimination: Will not experience complications related to bowel motility 11/13/2017 0635 - Progressing by Arletta Bale, RN Will not experience complications related to urinary retention 11/13/2017 0635 - Progressing by Arletta Bale, RN Pain Managment: General experience of comfort will improve 11/13/2017 0635 - Progressing by Arletta Bale, RN Safety: Ability to remain free from injury will improve 11/13/2017 0635 - Progressing by Arletta Bale, RN Skin Integrity: Risk for impaired skin integrity will decrease 11/13/2017 0635 - Progressing by Arletta Bale, RN Education: Ability to demonstrate management of disease process will improve 11/13/2017 0635 - Progressing by Arletta Bale, RN Ability to verbalize understanding of medication therapies will improve 11/13/2017 0635 - Progressing by  Arletta Bale, RN Activity: Capacity to carry out activities will improve 11/13/2017 0635 - Progressing by Arletta Bale, RN Cardiac: Ability to achieve and maintain adequate cardiopulmonary perfusion will improve 11/13/2017 0635 - Progressing by Arletta Bale, RN

## 2017-11-13 NOTE — Progress Notes (Addendum)
Progress Note  Patient Name: Henry Moreno Date of Encounter: 11/13/2017  Primary Cardiologist: Dr. Hilbert Odor ([place]) Primary Electrophysiologist: Dr. Glennon Mac ([facility])  Subjective   Remains CP, none since his shock, denies SOB, no palpitations  Inpatient Medications    Scheduled Meds: . acetaminophen  1,000 mg Oral BID  . carvedilol  12.5 mg Oral BID WC  . enoxaparin (LOVENOX) injection  40 mg Subcutaneous Q24H  . furosemide  40 mg Intravenous Daily  . irbesartan  75 mg Oral Daily  . loratadine  10 mg Oral Daily  . mouth rinse  15 mL Mouth Rinse BID  . pneumococcal 23 valent vaccine  0.5 mL Intramuscular Tomorrow-1000   Continuous Infusions: . amiodarone 30 mg/hr (11/13/17 0600)   PRN Meds: nitroGLYCERIN, ondansetron (ZOFRAN) IV   Vital Signs    Vitals:   11/13/17 0350 11/13/17 0400 11/13/17 0500 11/13/17 0602  BP:  97/60 101/65 (!) 78/63  Pulse:  61 60 (!) 59  Resp:  (!) 22 20 (!) 24  Temp: 97.7 F (36.5 C)     TempSrc: Oral     SpO2:  92% 90% 92%  Weight:   238 lb 12.1 oz (108.3 kg)   Height:        Intake/Output Summary (Last 24 hours) at 11/13/2017 0711 Last data filed at 11/13/2017 0600 Gross per 24 hour  Intake 348.18 ml  Output 1075 ml  Net -726.82 ml   Filed Weights   11/12/17 1517 11/12/17 2000 11/13/17 0500  Weight: 227 lb (103 kg) 238 lb 15.7 oz (108.4 kg) 238 lb 12.1 oz (108.3 kg)    Telemetry    SR, c/w frequent single PVCs- Personally Reviewed  ECG    No new EKG - Personally Reviewed  Physical Exam   GEN: No acute distress.   Neck: No JVD Cardiac: RRR, no murmurs, rubs, or gallops.  Respiratory: soft crackles at bases. GI: Soft, nontender, non-distended  MS: No edema; No deformity. Neuro:  Nonfocal  Psych: Normal affect   Labs    Chemistry Recent Labs  Lab 11/12/17 1436 11/12/17 2049  NA 136  --   K 5.6* 4.2  CL 103  --   CO2 21*  --   GLUCOSE 156*  --   BUN 17  --   CREATININE 1.25*  --   CALCIUM 8.7*  --     GFRNONAA 59*  --   GFRAA >60  --   ANIONGAP 12  --      Hematology Recent Labs  Lab 11/12/17 1436  WBC 13.0*  RBC 4.18*  HGB 14.1  HCT 42.1  MCV 100.7*  MCH 33.7  MCHC 33.5  RDW 13.6  PLT 211    Cardiac Enzymes Recent Labs  Lab 11/12/17 2049 11/13/17 0211  TROPONINI 1.13* 1.60*    Recent Labs  Lab 11/12/17 1516  TROPIPOC 0.13*     BNPNo results for input(s): BNP, PROBNP in the last 168 hours.   DDimer No results for input(s): DDIMER in the last 168 hours.   Radiology    Dg Chest Port 1 View Result Date: 11/12/2017 CLINICAL DATA:  67 year old male with chest pain for 1 day. EXAM: PORTABLE CHEST 1 VIEW COMPARISON:  None available. FINDINGS: Portable AP semi upright view at 1428 hours. Left chest 3 lead cardiac AICD. The patient is rotated to the left. Cardiac size is at the upper limits of normal to mildly enlarged. Other mediastinal contours are within normal limits. Visualized tracheal air column is  within normal limits. Low lung volumes, but allowing for portable technique the lungs are clear. No pneumothorax. IMPRESSION: No acute cardiopulmonary abnormality. Electronically Signed   By: Genevie Ann M.D.   On: 11/12/2017 14:57    Cardiac Studies   Echo is pending  Patient Profile     67 y.o. male with a hx of HTN, HLD, paroxysmal atrial fibrillation on Eliquis, history of combined diastolic and systolic heart failure, nonischemic cardiomyopathy, V. fib arrest on 01/25/2010 status post Medtronic biventricular ICD device implantation on 01/2010, status post generator change out on 10/11/2015 admitted with VT/VF event, ICD shock.  Last EP note I see with Dr. Glennon Mac 11/16/16 nonischemic cardiomyopathy, left bundle branch block, NYHA Class II-III congestive heart failure, left ventricular ejection fraction 25%. He underwent CRT-D implantation 01/31/2010. He has had difficulty with diaphragmatic pacing requiring adjustment of his LV lead output. Post CRT his EF improved to 45%.  Device interrogation showed afib (asymptomatic) and NOAC was started in 2013. In 01/2014, he was noted to have a decrease in BiV pacing to 88% due to atrial fibrillation/tachycardia. Magnesium was started and coreg dose increased. BiV pacing percentage was subsequently 96%.   The patient had an ICD shock 04/2015 for PMVT/VF. This was in the setting of missing doses of carvedilol. Medication compliance was stressed to the patient. Battery noted to be at [facility] 07/2015 and he is s/p generator changeout 09/2015. On April/2017 EP appointment, BiV pacing decreased to 85% due to PVCs, the patient was initiated on amiodarone.   Assessment & Plan    1. 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 Acute on chronic heart failure due to nonischemic cardiomyopathy     Optivol and CXR do not suggest overt fluid OL     His O2 sats 88-91 on 4 L when talking, at rest 90's     Given prolonged VT, llikely in some acute HF     Fluid neg - 763ml      IV lasix again today  3. Hyperkalemia     likely spurious, repeat was wnl, BMET looks OK  4. No known CAD     abnormal Trops, last 1.6     No ongoing or recurrent CP     D/w Dr. Curt Bears, no need for further monitoring, no hep gtt     Likely demand given prolonged VT episodes/VF and shock  Planned for cath tomorrow, d/w patient, he has taken low dose ASA routinely historically without allergy though tended to get some nose bleeds and stopped Doraine Schexnider add today given plans for cath/possible PCI  5. Paroxysmal AFib     CHA2DS2Vasc is 2, on Eliquis out patient     No AFib of late or now, Oreoluwa Gilmer hold for plans of cath Wed      For questions or updates, please contact  [place] Please consult [email or web address] for contact info under Cardiology/STEMI.      Signed, Baldwin Jamaica, PA-C  11/13/2017, 7:11 AM    I have seen and examined this patient with Tommye Standard.  Agree with above, note added to reflect my findings.  On  exam, RRR, faint crackles, no murmurs.  No further VT overnight.  Patient continued to have a high burden of PVCs.  We Maclovio Henson continue with IV amiodarone today plan for echo today with left heart catheterization tomorrow.  He does continue to desaturate with speaking and has crackles mildly over his lung bases.  Kathye Cipriani re-dose with Lasix today.  Akansha Wyche M. Isom Kochan MD 11/13/2017 9:02 AM

## 2017-11-14 ENCOUNTER — Inpatient Hospital Stay (HOSPITAL_COMMUNITY)
Admission: EM | Disposition: A | Discharge status: Home / Self Care | Payer: Self-pay | Source: Home / Self Care | Attending: Internal Medicine

## 2017-11-14 HISTORY — PX: ULTRASOUND GUIDANCE FOR VASCULAR ACCESS: SHX6516

## 2017-11-14 HISTORY — PX: LEFT HEART CATH AND CORONARY ANGIOGRAPHY: CATH118249

## 2017-11-14 LAB — BASIC METABOLIC PANEL
ANION GAP: 9 (ref 5–15)
BUN: 16 mg/dL (ref 6–20)
CHLORIDE: 102 mmol/L (ref 101–111)
CO2: 25 mmol/L (ref 22–32)
Calcium: 8.3 mg/dL — ABNORMAL LOW (ref 8.9–10.3)
Creatinine, Ser: 1.06 mg/dL (ref 0.61–1.24)
GFR calc non Af Amer: >60 mL/min (ref >60)
Glucose, Bld: 102 mg/dL — ABNORMAL HIGH (ref 65–99)
POTASSIUM: 3.6 mmol/L (ref 3.5–5.1)
SODIUM: 136 mmol/L (ref 135–145)

## 2017-11-14 SURGERY — LEFT HEART CATH AND CORONARY ANGIOGRAPHY
Anesthesia: LOCAL

## 2017-11-14 MED ORDER — SODIUM CHLORIDE 0.9 % IV SOLN: 250.0000 mL | INTRAVENOUS | Status: DC | PRN

## 2017-11-14 MED ORDER — IOPAMIDOL (ISOVUE-370) INJECTION 76%
INTRAVENOUS | Status: DC | PRN
  Administered 2017-11-14: 55 mL via INTRAVENOUS

## 2017-11-14 MED ORDER — FENTANYL CITRATE (PF) 100 MCG/2ML IJ SOLN
INTRAMUSCULAR | Status: DC | PRN
  Administered 2017-11-14 (×2): 25 ug via INTRAVENOUS

## 2017-11-14 MED ORDER — VERAPAMIL HCL 2.5 MG/ML IV SOLN
INTRAVENOUS | Status: AC
  Filled 2017-11-14: qty 2

## 2017-11-14 MED ORDER — VERAPAMIL HCL 2.5 MG/ML IV SOLN
INTRAVENOUS | Status: DC | PRN
  Administered 2017-11-14 (×2): 10 mL via INTRA_ARTERIAL

## 2017-11-14 MED ORDER — LIDOCAINE HCL (PF) 1 % IJ SOLN
INTRAMUSCULAR | Status: DC | PRN
  Administered 2017-11-14: 2 mL

## 2017-11-14 MED ORDER — HEPARIN (PORCINE) IN NACL 2-0.9 UNIT/ML-% IJ SOLN
INTRAMUSCULAR | Status: AC
  Filled 2017-11-14: qty 1000

## 2017-11-14 MED ORDER — AMIODARONE HCL 200 MG PO TABS
400.0000 mg | ORAL_TABLET | Freq: Two times a day (BID) | ORAL | Status: DC
  Administered 2017-11-14 – 2017-11-15 (×2): 400 mg via ORAL
  Filled 2017-11-14 (×2): qty 2

## 2017-11-14 MED ORDER — SODIUM CHLORIDE 0.9 % IV SOLN: INTRAVENOUS | Status: DC

## 2017-11-14 MED ORDER — ASPIRIN 81 MG PO CHEW
81.0000 mg | CHEWABLE_TABLET | ORAL | Status: AC
  Administered 2017-11-14: 81 mg via ORAL
  Filled 2017-11-14: qty 1

## 2017-11-14 MED ORDER — SODIUM CHLORIDE 0.9% FLUSH
3.0000 mL | Freq: Two times a day (BID) | INTRAVENOUS | Status: DC
  Administered 2017-11-14 – 2017-11-15 (×2): 3 mL via INTRAVENOUS

## 2017-11-14 MED ORDER — HEPARIN SODIUM (PORCINE) 1000 UNIT/ML IJ SOLN
INTRAMUSCULAR | Status: DC | PRN
  Administered 2017-11-14: 5000 [IU] via INTRAVENOUS

## 2017-11-14 MED ORDER — HEPARIN (PORCINE) IN NACL 2-0.9 UNIT/ML-% IJ SOLN
INTRAMUSCULAR | Status: AC | PRN
  Administered 2017-11-14 (×2): 500 mL

## 2017-11-14 MED ORDER — AMIODARONE HCL IN DEXTROSE 360-4.14 MG/200ML-% IV SOLN: 30.0000 mg/h | INTRAVENOUS | Status: AC

## 2017-11-14 MED ORDER — ASPIRIN 81 MG PO CHEW: 81.0000 mg | CHEWABLE_TABLET | ORAL | Status: DC

## 2017-11-14 MED ORDER — MIDAZOLAM HCL 2 MG/2ML IJ SOLN
INTRAMUSCULAR | Status: DC | PRN
  Administered 2017-11-14 (×2): 1 mg via INTRAVENOUS

## 2017-11-14 MED ORDER — SODIUM CHLORIDE 0.9 % IV SOLN
INTRAVENOUS | Status: DC
  Administered 2017-11-14: 06:00:00 via INTRAVENOUS

## 2017-11-14 MED ORDER — LIDOCAINE HCL (PF) 1 % IJ SOLN
INTRAMUSCULAR | Status: AC
  Filled 2017-11-14: qty 30

## 2017-11-14 MED ORDER — SODIUM CHLORIDE 0.9% FLUSH
3.0000 mL | Freq: Two times a day (BID) | INTRAVENOUS | Status: DC
  Administered 2017-11-14: 3 mL via INTRAVENOUS

## 2017-11-14 MED ORDER — IOPAMIDOL (ISOVUE-370) INJECTION 76%
INTRAVENOUS | Status: AC
  Filled 2017-11-14: qty 100

## 2017-11-14 MED ORDER — HEPARIN SODIUM (PORCINE) 1000 UNIT/ML IJ SOLN
INTRAMUSCULAR | Status: AC
  Filled 2017-11-14: qty 1

## 2017-11-14 MED ORDER — ACETAMINOPHEN 325 MG PO TABS: 650.0000 mg | ORAL_TABLET | ORAL | Status: DC | PRN

## 2017-11-14 MED ORDER — FENTANYL CITRATE (PF) 100 MCG/2ML IJ SOLN
INTRAMUSCULAR | Status: AC
  Filled 2017-11-14: qty 2

## 2017-11-14 MED ORDER — MIDAZOLAM HCL 2 MG/2ML IJ SOLN
INTRAMUSCULAR | Status: AC
  Filled 2017-11-14: qty 2

## 2017-11-14 MED ORDER — SODIUM CHLORIDE 0.9% FLUSH: 3.0000 mL | INTRAVENOUS | Status: DC | PRN

## 2017-11-14 MED ORDER — ONDANSETRON HCL 4 MG/2ML IJ SOLN: 4.0000 mg | Freq: Four times a day (QID) | INTRAMUSCULAR | Status: DC | PRN

## 2017-11-14 SURGICAL SUPPLY — 12 items
BAND ZEPHYR COMPRESS 30 LONG (HEMOSTASIS) ×3 IMPLANT
CATH IMPULSE 5F ANG/FL3.5 (CATHETERS) ×3 IMPLANT
COVER PRB 48X5XTLSCP FOLD TPE (BAG) ×2 IMPLANT
COVER PROBE 5X48 (BAG) ×1
GUIDEWIRE INQWIRE 1.5J.035X260 (WIRE) ×2 IMPLANT
INQWIRE 1.5J .035X260CM (WIRE) ×3
KIT HEART LEFT (KITS) ×3 IMPLANT
NEEDLE PERC 21GX4CM (NEEDLE) ×3 IMPLANT
PACK CARDIAC CATHETERIZATION (CUSTOM PROCEDURE TRAY) ×3 IMPLANT
SHEATH RAIN RADIAL 21G 6FR (SHEATH) ×3 IMPLANT
TRANSDUCER W/STOPCOCK (MISCELLANEOUS) ×3 IMPLANT
TUBING CIL FLEX 10 FLL-RA (TUBING) ×3 IMPLANT

## 2017-11-14 NOTE — Interval H&P Note (Signed)
Cath Lab Visit (complete for each Cath Lab visit)  Clinical Evaluation Leading to the Procedure:   ACS: Yes.    Non-ACS:    Anginal Classification: CCS IV  Anti-ischemic medical therapy: Minimal Therapy (1 class of medications)  Non-Invasive Test Results: No non-invasive testing performed  Prior CABG: No previous CABG      History and Physical Interval Note:  11/14/2017 1:32 PM  Henry Moreno  has presented today for surgery, with the diagnosis of vf  The various methods of treatment have been discussed with the patient and family. After consideration of risks, benefits and other options for treatment, the patient has consented to  Procedure(s): LEFT HEART CATH AND CORONARY ANGIOGRAPHY (N/A) as a surgical intervention .  The patient's history has been reviewed, patient examined, no change in status, stable for surgery.  I have reviewed the patient's chart and labs.  Questions were answered to the patient's satisfaction.     Larae Grooms

## 2017-11-14 NOTE — Progress Notes (Signed)
   11/14/17 2227  Vitals  Temp 98 F (36.7 C)  Temp Source Oral  BP (!) 107/55  BP Location Left Arm  BP Method Automatic  Patient Position (if appropriate) Sitting  Pulse Rate 100  Pulse Rate Source Dinamap  ECG Heart Rate (!) 104  Cardiac Rhythm Ventricular paced  Resp 18  Oxygen Therapy  SpO2 90 %  O2 Device Room Air  ECG Intervals  QRS interval 0.12  QTc interval 0.36  Admitted pt to rm 3E17 from [place], pt alert and oriented, denied pain at this time, oriented to room,call bell placed within reach, placed on cardiac monitor, CCMD made aware. Educated on Social worker. Pt refused bed alarm on. Will continue to do intentional rounding.

## 2017-11-14 NOTE — H&P (View-Only) (Signed)
Progress Note  Patient Name: Henry Moreno Date of Encounter: 11/14/2017  Primary Cardiologist: Dr. Hilbert Odor ([facility]) Primary Electrophysiologist: Dr. Glennon Mac ([facility])  Subjective   Remains CP free, none since his shock, denies SOB, no palpitations.  Mentions feels like he could go home  Inpatient Medications    Scheduled Meds: . acetaminophen  1,000 mg Oral BID  . aspirin EC  81 mg Oral Daily  . carvedilol  25 mg Oral BID WC  . enoxaparin (LOVENOX) injection  40 mg Subcutaneous Q24H  . furosemide  40 mg Intravenous Daily  . irbesartan  75 mg Oral Daily  . loratadine  10 mg Oral Daily  . mouth rinse  15 mL Mouth Rinse BID  . sodium chloride flush  3 mL Intravenous Q12H   Continuous Infusions: . sodium chloride    . sodium chloride 10 mL/hr at 11/14/17 0606  . amiodarone 30 mg/hr (11/14/17 0800)   PRN Meds: sodium chloride, nitroGLYCERIN, ondansetron (ZOFRAN) IV, sodium chloride flush   Vital Signs    Vitals:   11/14/17 0600 11/14/17 0700 11/14/17 0800 11/14/17 0900  BP: 114/60 127/66 129/79 119/77  Pulse: (!) 48 78 63 63  Resp: (!) 21 (!) 32 (!) 23 18  Temp:   98 F (36.7 C)   TempSrc:   Oral   SpO2: 96% (!) 88% 91% 92%  Weight:      Height:        Intake/Output Summary (Last 24 hours) at 11/14/2017 1031 Last data filed at 11/14/2017 0900 Gross per 24 hour  Intake 384.1 ml  Output 1950 ml  Net -1565.9 ml   Filed Weights   11/12/17 1517 11/12/17 2000 11/13/17 0500  Weight: 227 lb (103 kg) 238 lb 15.7 oz (108.4 kg) 238 lb 12.1 oz (108.3 kg)    Telemetry    SR, c/w frequent single PVCs- Personally Reviewed  ECG    No new EKG - Personally Reviewed  Physical Exam   GEN: No acute distress.   Neck: No JVD Cardiac: RRR, no murmurs, rubs, or gallops.  Respiratory:  lungs are clear. GI: Soft, nontender, non-distended  MS: No edema; No deformity. Neuro:  Nonfocal  Psych: Normal affect   Labs    Chemistry Recent Labs  Lab 11/12/17 1436  11/12/17 2049 11/13/17 0705 11/14/17 0239  NA 136  --  137 136  K 5.6* 4.2 4.2 3.6  CL 103  --  105 102  CO2 21*  --  19* 25  GLUCOSE 156*  --  107* 102*  BUN 17  --  13 16  CREATININE 1.25*  --  0.95 1.06  CALCIUM 8.7*  --  8.6* 8.3*  GFRNONAA 59*  --  >60 >60  GFRAA >60  --  >60 >60  ANIONGAP 12  --  13 9     Hematology Recent Labs  Lab 11/12/17 1436  WBC 13.0*  RBC 4.18*  HGB 14.1  HCT 42.1  MCV 100.7*  MCH 33.7  MCHC 33.5  RDW 13.6  PLT 211    Cardiac Enzymes Recent Labs  Lab 11/12/17 2049 11/13/17 0211 11/13/17 0705  TROPONINI 1.13* 1.60* 1.60*    Recent Labs  Lab 11/12/17 1516  TROPIPOC 0.13*     BNPNo results for input(s): BNP, PROBNP in the last 168 hours.   DDimer No results for input(s): DDIMER in the last 168 hours.   Radiology    Dg Chest Port 1 View Result Date: 11/12/2017 CLINICAL DATA:  67 year old male  with chest pain for 1 day. EXAM: PORTABLE CHEST 1 VIEW COMPARISON:  None available. FINDINGS: Portable AP semi upright view at 1428 hours. Left chest 3 lead cardiac AICD. The patient is rotated to the left. Cardiac size is at the upper limits of normal to mildly enlarged. Other mediastinal contours are within normal limits. Visualized tracheal air column is within normal limits. Low lung volumes, but allowing for portable technique the lungs are clear. No pneumothorax. IMPRESSION: No acute cardiopulmonary abnormality. Electronically Signed   By: Genevie Ann M.D.   On: 11/12/2017 14:57    Cardiac Studies   11/13/17: TTE Study Conclusions - Left ventricle: The cavity size was mildly dilated. Wall   thickness was normal. Systolic function was severely reduced. The   estimated ejection fraction was in the range of 25% to 30%.   Severe diffuse hypokinesis with no identifiable regional   variations. Features are consistent with a pseudonormal left   ventricular filling pattern, with concomitant abnormal relaxation   and increased filling pressure  (grade 2 diastolic dysfunction).   No evidence of thrombus. - Ventricular septum: Septal motion showed paradox. These changes   are consistent with intraventricular conduction delay. - Mitral valve: There was mild regurgitation directed eccentrically   and posteriorly. - Left atrium: The atrium was moderately to severely dilated.   Patient Profile     67 y.o. male with a hx of HTN, HLD, paroxysmal atrial fibrillation on Eliquis, history of combined diastolic and systolic heart failure, nonischemic cardiomyopathy, V. fib arrest on 01/25/2010 status post Medtronic biventricular ICD device implantation on 01/2010, status post generator change out on 10/11/2015 admitted with VT/VF event, ICD shock.  Last EP note I see with Dr. Glennon Mac 11/16/16 nonischemic cardiomyopathy, left bundle branch block, NYHA Class II-III congestive heart failure, left ventricular ejection fraction 25%. He underwent CRT-D implantation 01/31/2010. He has had difficulty with diaphragmatic pacing requiring adjustment of his LV lead output. Post CRT his EF improved to 45%. Device interrogation showed afib (asymptomatic) and NOAC was started in 2013. In 01/2014, he was noted to have a decrease in BiV pacing to 88% due to atrial fibrillation/tachycardia. Magnesium was started and coreg dose increased. BiV pacing percentage was subsequently 96%.   The patient had an ICD shock 04/2015 for PMVT/VF. This was in the setting of missing doses of carvedilol. Medication compliance was stressed to the patient. Battery noted to be at [facility] 07/2015 and he is s/p generator changeout 09/2015. On April/2017 EP appointment, BiV pacing decreased to 85% due to PVCs, the patient was initiated on amiodarone.   Assessment & Plan    1. MMVT >> PMVT      ICD interrogation:      Battery and lead testing are good      Pt had just over 2 hours of MMVT rates 170's bpm in monitor zone followed by PMVT successful shocked to SR w/1 shock  Continue finish current  Amiodarone gtt bag and transition to PO Eliquis on hold here for cath tomorrow Device was reprogrammed to include VT zone with ATP therapies/shock  2. Hx of NICM     LVEF 25-30%, grade 2 DD     Optivol and CXR do not suggest overt fluid OL     His O2 sats still dip occasionally to 88-91 on 4 L when off O2     Given prolonged VT, llikely in some acute HF     Fluid neg - 2469ml cummulatively     IV lasix  again today, continue to try wean off  3. Hyperkalemia     likely spurious, repeat was wnl, BMET looks OK  4. No known CAD     abnormal Trops, last 1.6     No ongoing or recurrent CP     D/w Dr. Curt Bears, no need for further monitoring, no hep gtt     Likely demand given prolonged VT episodes/VF and shock  Planned for cath today  d/w patient, he has taken low dose ASA routinely historically without allergy though tended to get some nose bleeds and stopped Getting here without reaction  5. Paroxysmal AFib     CHA2DS2Vasc is 2, on Eliquis out patient     No AFib of late or now, will hold pending cath today      For questions or updates, please contact [place] Please consult [email or web address] for contact info under Cardiology/STEMI.      Signed, Baldwin Jamaica, PA-C  11/14/2017, 10:31 AM     I have seen and examined this patient with Tommye Standard.  Agree with above, note added to reflect my findings.  On exam, RRR, no murmurs, lungs clear.  Echo done yesterday showed an EF of 25-30% which is apparently consistent with his prior echoes.  We will plan for left heart catheterization today.  Will finish out the current bag of amiodarone and then switch him to 400 mg twice a day.  She left heart catheterization not show any major abnormalities, would continue him on 400 mg of amiodarone twice a day and discharged with prompt follow-up with [facility] cardiology.  Will M. Camnitz MD 11/14/2017 10:52 AM

## 2017-11-14 NOTE — Progress Notes (Addendum)
Progress Note  Patient Name: Henry Moreno Date of Encounter: 11/14/2017  Primary Cardiologist: Dr. Hilbert Odor ([place]) Primary Electrophysiologist: Dr. Glennon Mac ([facility])  Subjective   Remains CP free, none since his shock, denies SOB, no palpitations.  Mentions feels like he could go home  Inpatient Medications    Scheduled Meds: . acetaminophen  1,000 mg Oral BID  . aspirin EC  81 mg Oral Daily  . carvedilol  25 mg Oral BID WC  . enoxaparin (LOVENOX) injection  40 mg Subcutaneous Q24H  . furosemide  40 mg Intravenous Daily  . irbesartan  75 mg Oral Daily  . loratadine  10 mg Oral Daily  . mouth rinse  15 mL Mouth Rinse BID  . sodium chloride flush  3 mL Intravenous Q12H   Continuous Infusions: . sodium chloride    . sodium chloride 10 mL/hr at 11/14/17 0606  . amiodarone 30 mg/hr (11/14/17 0800)   PRN Meds: sodium chloride, nitroGLYCERIN, ondansetron (ZOFRAN) IV, sodium chloride flush   Vital Signs    Vitals:   11/14/17 0600 11/14/17 0700 11/14/17 0800 11/14/17 0900  BP: 114/60 127/66 129/79 119/77  Pulse: (!) 48 78 63 63  Resp: (!) 21 (!) 32 (!) 23 18  Temp:   98 F (36.7 C)   TempSrc:   Oral   SpO2: 96% (!) 88% 91% 92%  Weight:      Height:        Intake/Output Summary (Last 24 hours) at 11/14/2017 1031 Last data filed at 11/14/2017 0900 Gross per 24 hour  Intake 384.1 ml  Output 1950 ml  Net -1565.9 ml   Filed Weights   11/12/17 1517 11/12/17 2000 11/13/17 0500  Weight: 227 lb (103 kg) 238 lb 15.7 oz (108.4 kg) 238 lb 12.1 oz (108.3 kg)    Telemetry    SR, c/w frequent single PVCs- Personally Reviewed  ECG    No new EKG - Personally Reviewed  Physical Exam   GEN: No acute distress.   Neck: No JVD Cardiac: RRR, no murmurs, rubs, or gallops.  Respiratory:  lungs are clear. GI: Soft, nontender, non-distended  MS: No edema; No deformity. Neuro:  Nonfocal  Psych: Normal affect   Labs    Chemistry Recent Labs  Lab 11/12/17 1436  11/12/17 2049 11/13/17 0705 11/14/17 0239  NA 136  --  137 136  K 5.6* 4.2 4.2 3.6  CL 103  --  105 102  CO2 21*  --  19* 25  GLUCOSE 156*  --  107* 102*  BUN 17  --  13 16  CREATININE 1.25*  --  0.95 1.06  CALCIUM 8.7*  --  8.6* 8.3*  GFRNONAA 59*  --  >60 >60  GFRAA >60  --  >60 >60  ANIONGAP 12  --  13 9     Hematology Recent Labs  Lab 11/12/17 1436  WBC 13.0*  RBC 4.18*  HGB 14.1  HCT 42.1  MCV 100.7*  MCH 33.7  MCHC 33.5  RDW 13.6  PLT 211    Cardiac Enzymes Recent Labs  Lab 11/12/17 2049 11/13/17 0211 11/13/17 0705  TROPONINI 1.13* 1.60* 1.60*    Recent Labs  Lab 11/12/17 1516  TROPIPOC 0.13*     BNPNo results for input(s): BNP, PROBNP in the last 168 hours.   DDimer No results for input(s): DDIMER in the last 168 hours.   Radiology    Dg Chest Port 1 View Result Date: 11/12/2017 CLINICAL DATA:  67 year old male  with chest pain for 1 day. EXAM: PORTABLE CHEST 1 VIEW COMPARISON:  None available. FINDINGS: Portable AP semi upright view at 1428 hours. Left chest 3 lead cardiac AICD. The patient is rotated to the left. Cardiac size is at the upper limits of normal to mildly enlarged. Other mediastinal contours are within normal limits. Visualized tracheal air column is within normal limits. Low lung volumes, but allowing for portable technique the lungs are clear. No pneumothorax. IMPRESSION: No acute cardiopulmonary abnormality. Electronically Signed   By: Genevie Ann M.D.   On: 11/12/2017 14:57    Cardiac Studies   11/13/17: TTE Study Conclusions - Left ventricle: The cavity size was mildly dilated. Wall   thickness was normal. Systolic function was severely reduced. The   estimated ejection fraction was in the range of 25% to 30%.   Severe diffuse hypokinesis with no identifiable regional   variations. Features are consistent with a pseudonormal left   ventricular filling pattern, with concomitant abnormal relaxation   and increased filling pressure  (grade 2 diastolic dysfunction).   No evidence of thrombus. - Ventricular septum: Septal motion showed paradox. These changes   are consistent with intraventricular conduction delay. - Mitral valve: There was mild regurgitation directed eccentrically   and posteriorly. - Left atrium: The atrium was moderately to severely dilated.   Patient Profile     67 y.o. male with a hx of HTN, HLD, paroxysmal atrial fibrillation on Eliquis, history of combined diastolic and systolic heart failure, nonischemic cardiomyopathy, V. fib arrest on 01/25/2010 status post Medtronic biventricular ICD device implantation on 01/2010, status post generator change out on 10/11/2015 admitted with VT/VF event, ICD shock.  Last EP note I see with Dr. Glennon Mac 11/16/16 nonischemic cardiomyopathy, left bundle branch block, NYHA Class II-III congestive heart failure, left ventricular ejection fraction 25%. He underwent CRT-D implantation 01/31/2010. He has had difficulty with diaphragmatic pacing requiring adjustment of his LV lead output. Post CRT his EF improved to 45%. Device interrogation showed afib (asymptomatic) and NOAC was started in 2013. In 01/2014, he was noted to have a decrease in BiV pacing to 88% due to atrial fibrillation/tachycardia. Magnesium was started and coreg dose increased. BiV pacing percentage was subsequently 96%.   The patient had an ICD shock 04/2015 for PMVT/VF. This was in the setting of missing doses of carvedilol. Medication compliance was stressed to the patient. Battery noted to be at [facility] 07/2015 and he is s/p generator changeout 09/2015. On April/2017 EP appointment, BiV pacing decreased to 85% due to PVCs, the patient was initiated on amiodarone.   Assessment & Plan    1. MMVT >> PMVT      ICD interrogation:      Battery and lead testing are good      Pt had just over 2 hours of MMVT rates 170's bpm in monitor zone followed by PMVT successful shocked to SR w/1 shock  Continue finish current  Amiodarone gtt bag and transition to PO Eliquis on hold here for cath tomorrow Device was reprogrammed to include VT zone with ATP therapies/shock  2. Hx of NICM     LVEF 25-30%, grade 2 DD     Optivol and CXR do not suggest overt fluid OL     His O2 sats still dip occasionally to 88-91 on 4 L when off O2     Given prolonged VT, llikely in some acute HF     Fluid neg - 2460ml cummulatively     IV lasix  again today, continue to try wean off  3. Hyperkalemia     likely spurious, repeat was wnl, BMET looks OK  4. No known CAD     abnormal Trops, last 1.6     No ongoing or recurrent CP     D/w Dr. Curt Bears, no need for further monitoring, no hep gtt     Likely demand given prolonged VT episodes/VF and shock  Planned for cath today  d/w patient, he has taken low dose ASA routinely historically without allergy though tended to get some nose bleeds and stopped Getting here without reaction  5. Paroxysmal AFib     CHA2DS2Vasc is 2, on Eliquis out patient     No AFib of late or now, will hold pending cath today      For questions or updates, please contact [place] Please consult [email or web address] for contact info under Cardiology/STEMI.      Signed, Baldwin Jamaica, PA-C  11/14/2017, 10:31 AM     I have seen and examined this patient with Tommye Standard.  Agree with above, note added to reflect my findings.  On exam, RRR, no murmurs, lungs clear.  Echo done yesterday showed an EF of 25-30% which is apparently consistent with his prior echoes.  We will plan for left heart catheterization today.  Will finish out the current bag of amiodarone and then switch him to 400 mg twice a day.  She left heart catheterization not show any major abnormalities, would continue him on 400 mg of amiodarone twice a day and discharged with prompt follow-up with [facility] cardiology.  Will M. Camnitz MD 11/14/2017 10:52 AM

## 2017-11-15 ENCOUNTER — Encounter (HOSPITAL_COMMUNITY): Payer: Self-pay | Admitting: Interventional Cardiology

## 2017-11-15 LAB — BASIC METABOLIC PANEL
Anion gap: 10 (ref 5–15)
BUN: 17 mg/dL (ref 6–20)
CO2: 28 mmol/L (ref 22–32)
CREATININE: 1.09 mg/dL (ref 0.61–1.24)
Calcium: 8.7 mg/dL — ABNORMAL LOW (ref 8.9–10.3)
Chloride: 100 mmol/L — ABNORMAL LOW (ref 101–111)
Glucose, Bld: 115 mg/dL — ABNORMAL HIGH (ref 65–99)
Potassium: 3.7 mmol/L (ref 3.5–5.1)
SODIUM: 138 mmol/L (ref 135–145)

## 2017-11-15 MED ORDER — APIXABAN 5 MG PO TABS
5.0000 mg | ORAL_TABLET | Freq: Two times a day (BID) | ORAL | Status: DC
  Administered 2017-11-15: 5 mg via ORAL
  Filled 2017-11-15: qty 1

## 2017-11-15 MED ORDER — AMIODARONE HCL 200 MG PO TABS: 400.0000 mg | ORAL_TABLET | Freq: Two times a day (BID) | ORAL | 0 refills | Status: DC

## 2017-11-15 MED ORDER — SACUBITRIL-VALSARTAN 24-26 MG PO TABS
1.0000 | ORAL_TABLET | Freq: Two times a day (BID) | ORAL | Status: DC
  Administered 2017-11-15: 1 via ORAL
  Filled 2017-11-15 (×2): qty 1

## 2017-11-15 MED ORDER — CARVEDILOL 25 MG PO TABS: 25.0000 mg | ORAL_TABLET | Freq: Two times a day (BID) | ORAL | 1 refills | Status: DC

## 2017-11-15 MED ORDER — SACUBITRIL-VALSARTAN 24-26 MG PO TABS: 1.0000 | ORAL_TABLET | Freq: Two times a day (BID) | ORAL | 1 refills | Status: DC

## 2017-11-15 MED ORDER — FUROSEMIDE 20 MG PO TABS: 20.0000 mg | ORAL_TABLET | Freq: Every day | ORAL | 1 refills | Status: DC | PRN

## 2017-11-15 MED FILL — Heparin Sodium (Porcine) 2 Unit/ML in Sodium Chloride 0.9%: INTRAMUSCULAR | Qty: 1000 | Status: AC

## 2017-11-15 NOTE — Progress Notes (Signed)
Discharge to home, pt alert and oriented, no complaints of any discomfort upon discharge. D/c instructions and follow up appointment  Discussed with patient,verbalized understanding. PIV removed no s/s of infiltration noted.

## 2017-11-15 NOTE — Discharge Summary (Addendum)
ELECTROPHYSIOLOGY PROCEDURE DISCHARGE SUMMARY    Patient ID: Henry Moreno,  MRN: [id number], DOB/AGE: 09-18-1950 67 y.o.  Admit date: 11/12/2017 Discharge date: 11/15/2017  Primary Care Physician: Patient, No Pcp Per  Primary Cardiologist: Dr. Garlon Hatchet Electrophysiologist: Dr. [name]   Primary Discharge Diagnosis:  1. VT/VF  Secondary Discharge Diagnosis:  1. NICM 2. HTN 3. PAFib     CHA2DS2Vasc is 2 on Eliquis   Allergies  Allergen Reactions  . Ace Inhibitors Cough  . Ibuprofen Other (See Comments)    Other Reaction: HIVES / RASH     Procedures This Admission:  11/14/17: LHC   Mid RCA lesion is 25% stenosed.  Mid LAD lesion is 25% stenosed.  There is severe left ventricular systolic dysfunction.  LV end diastolic pressure is mildly elevated.  There is no aortic valve stenosis.  The left ventricular ejection fraction is less than 25% by visual estimate. Nonobstructive CAD.  Severe LV dysfunction.  Continue management per EP.   Brief HPI: Henry Moreno is a 67 y.o. male with a hx of HTN, HLD, OSA w/CPAP at home, paroxysmal atrial fibrillation on Eliquis, history of combined diastolic and systolic heart failure, nonischemic cardiomyopathy, V. fib arrest on 01/25/2010 status post Medtronic biventricular ICD device implantation on 01/2010, status post generator change out on 10/11/2015 was admitted to [facility] 10/15/17 after shock by his ICD   Hospital Course:  The patient reported he was in his usual state of health until this morning after arriving to work.  He started having mild, midsternal chest pain with no associated symptoms.  EMS was called.  On arrival per EMS report, he lost his pulses, there is mention of versed given it seems in preparation for cardioversion, though the patient went into VF arrest and received CPR followed by the patient's ICD shock back to sinus rhythm, started on IV amiodarone with bolus followed by gtt.[name]  He was transported  to [facility] ED in stable condition.  Dr. Caryl Comes reviewed device interrogation, noted battery and lead testing are good, Pt had just over 2 hours of MMVT rates 170's bpm in monitor zone followed by PMVT successful shocked to SR w/1 shock, in d/w the patient's EP, device reprogrammed include VT zone with ATP therapies/shock.  He was admitted to ICD< maintained on amiodarone gtt.    Trop were abnormal, he had anm element of CP associated with the prolonged VT, though none after his shock and suspect to be 2/2 the VT, planned for cath.  He underwent LHC noting no obstructive disease, his echo confirmed persistent NICM, LVEF 25-30%, grade II DD.  Initially requiring oxygen supplementation though diuresed nearly 3L and has maintained O2 sats 90's on RA.    He was transitioned to PPO amiodarone.  He has been ambulating in his room without difficulty, he has remained CP free throgh his hospital stay, maintained SR without recurrent VT/VF.  The patient was examined by Dr. Curt Bears and considered stable for discharge to home.   His ARB is being changed to [facility] and Rx lasix for PRN use, Dr. Curt Bears discussed with the patient when/how to take it, daily weights and minimizing sodium.    The patient was made aware of [place] law, no driving, he reports that he no longer drives.  The patient requests to have his initial follow up with [place], given the hospital stay and adjustment in medicines, with plans to return to his primary team afterwards.   Physical Exam: Vitals:   11/14/17  2227 11/15/17 0018 11/15/17 0430 11/15/17 1241  BP: (!) 107/55 103/70 104/69 103/60  Pulse: 100 99 87 65  Resp: 18 18 18 18   Temp: 98 F (36.7 C) 97.9 F (36.6 C) 97.6 F (36.4 C) 97.8 F (36.6 C)  TempSrc: Oral Oral Oral Oral  SpO2: 90% 95% 94% 93%  Weight:   235 lb 9.6 oz (106.9 kg)   Height:        GEN- The patient is well appearing, alert and oriented x 3 today.   HEENT: normocephalic, atraumatic; sclera clear,  conjunctiva pink; hearing intact; oropharynx clear Lungs- CTA b/l, normal work of breathing.  No wheezes, rales, rhonchi Heart- RRR, no murmurs, rubs or gallops, PMI not laterally displaced GI- soft, non-tender, non-distended Extremities- no clubbing, cyanosis, or edema; R wrist site is stable, no hematoma or bleeding MS- no significant deformity or atrophy Skin- warm and dry, no rash or lesion Psych- euthymic mood, full affect Neuro- no gross defecits  Labs:   Lab Results  Component Value Date   WBC 13.0 (H) 11/12/2017   HGB 14.1 11/12/2017   HCT 42.1 11/12/2017   MCV 100.7 (H) 11/12/2017   PLT 211 11/12/2017    Recent Labs  Lab 11/15/17 0357  NA 138  K 3.7  CL 100*  CO2 28  BUN 17  CREATININE 1.09  CALCIUM 8.7*  GLUCOSE 115*    Discharge Medications:  Allergies as of 11/15/2017      Reactions   Ace Inhibitors Cough   Ibuprofen Other (See Comments)   Other Reaction: HIVES / RASH      Allergies as of 11/15/2017      Reactions   Ace Inhibitors Cough   Ibuprofen Other (See Comments)   Other Reaction: HIVES / RASH      Medication List    STOP taking these medications   candesartan 4 MG tablet Commonly known as:  ATACAND     TAKE these medications   acetaminophen 500 MG tablet Commonly known as:  TYLENOL Take 1,000 mg by mouth 2 (two) times daily.   amiodarone 200 MG tablet Commonly known as:  PACERONE Take 200 mg by mouth at bedtime. What changed:  Another medication with the same name was added. Make sure you understand how and when to take each. Notes to patient:  Do not take this regime until you are back down to the 200mg  daily dose as instructed by the new prescription, as we discussed.   amiodarone 200 MG tablet Commonly known as:  PACERONE Take 2 tablets (400 mg total) by mouth 2 (two) times daily. START  by taking 400mg  (2 tabs) twice daily for 2 weeks then reduce to 200mg  (1 tab) twice daily for 2 weeks, then reduce back to your usual 200mg   daily What changed:  You were already taking a medication with the same name, and this prescription was added. Make sure you understand how and when to take each.   azelastine 0.1 % nasal spray Commonly known as:  ASTELIN Place 1 spray into both nostrils as needed.   benzonatate 200 MG capsule Commonly known as:  TESSALON Take 200 mg by mouth daily as needed.   carvedilol 25 MG tablet Commonly known as:  COREG Take 1 tablet (25 mg total) by mouth 2 (two) times daily with a meal. What changed:  how much to take   cetirizine 10 MG tablet Commonly known as:  ZYRTEC Take 10 mg by mouth at bedtime.   ELIQUIS 5  MG Tabs tablet Generic drug:  apixaban Take 5 mg by mouth 2 (two) times daily.   furosemide 20 MG tablet Commonly known as:  LASIX Take 1 tablet (20 mg total) by mouth daily as needed for fluid or edema (for weight gain of 3pounds in 24 hours or 5 pound in a week as discussed).   Magnesium 200 MG Tabs Take 200 mg by mouth at bedtime.   sacubitril-valsartan 24-26 MG Commonly known as:  ENTRESTO Take 1 tablet by mouth 2 (two) times daily.   Vitamin D3 3000 units Tabs Take 1 capsule by mouth daily. Taking a  2000mg  dose       Disposition:  Home  Discharge Instructions    Diet - low sodium heart healthy   Complete by:  As directed    Diet - low sodium heart healthy   Complete by:  As directed    Increase activity slowly   Complete by:  As directed    Increase activity slowly   Complete by:  As directed      Follow-up Information    Constance Haw, MD Follow up.   Specialty:  Cardiology Contact information: [place] [address] [phone]           Duration of Discharge Encounter: Greater than 30 minutes including physician time.  Signed, Tommye Standard, PA-C 11/15/2017 1:41 PM   I have seen and examined this patient with Tommye Standard.  Agree with above, note added to reflect my findings.  On exam, RRR, no murmurs,  lungs clear. Patient admitted with VT which degenerated to VF requiring ICD shocks. TTE with EF of 25-30% and nonobstructive CAD on cath. Amiodarone GGT started and now with PO amiodarone at discharge. Has also been having quite a bit of fatigue, Mayleigh Tetrault switch to [facility]. Plan for follow up in clinic in 2-3 weeks with possible transfer back to [facility] for further post hospital care.    [place] Emmitt M. Renezmae Canlas MD 11/15/2017 7:35 PM

## 2017-11-15 NOTE — Progress Notes (Signed)
Pt placed on CPAP with 3 lpm oxygen bled in tolerating well

## 2017-11-15 NOTE — Progress Notes (Signed)
Entresto coupon card given to patient with explanation of usage; pharmacy of choice is [facility] [phone]

## 2017-12-10 ENCOUNTER — Encounter: Payer: Self-pay | Admitting: Cardiology

## 2017-12-10 ENCOUNTER — Ambulatory Visit (INDEPENDENT_AMBULATORY_CARE_PROVIDER_SITE_OTHER): Payer: 59 | Admitting: Cardiology

## 2017-12-10 VITALS — BP 130/82 | HR 65 | Ht 67.0 in | Wt 244.8 lb

## 2017-12-10 DIAGNOSIS — I2510 Atherosclerotic heart disease of native coronary artery without angina pectoris: Secondary | ICD-10-CM | POA: Diagnosis not present

## 2017-12-10 DIAGNOSIS — Z95810 Presence of automatic (implantable) cardiac defibrillator: Secondary | ICD-10-CM | POA: Diagnosis not present

## 2017-12-10 DIAGNOSIS — I428 Other cardiomyopathies: Secondary | ICD-10-CM

## 2017-12-10 DIAGNOSIS — I472 Ventricular tachycardia, unspecified: Secondary | ICD-10-CM

## 2017-12-10 DIAGNOSIS — I4901 Ventricular fibrillation: Secondary | ICD-10-CM

## 2017-12-10 DIAGNOSIS — I10 Essential (primary) hypertension: Secondary | ICD-10-CM | POA: Diagnosis not present

## 2017-12-10 DIAGNOSIS — I5042 Chronic combined systolic (congestive) and diastolic (congestive) heart failure: Secondary | ICD-10-CM | POA: Diagnosis not present

## 2017-12-10 LAB — CUP PACEART INCLINIC DEVICE CHECK
Battery Voltage: 2.97 V
Brady Statistic AP VP Percent: 93.08 %
Brady Statistic AP VS Percent: 0.85 %
Brady Statistic AS VP Percent: 4.7 %
Brady Statistic AS VS Percent: 1.37 %
Date Time Interrogation Session: 20190415170745
HIGH POWER IMPEDANCE MEASURED VALUE: 49 Ohm
HIGH POWER IMPEDANCE MEASURED VALUE: 61 Ohm
Implantable Lead Implant Date: 20110606
Implantable Lead Implant Date: 20110606
Implantable Lead Location: 753858
Implantable Lead Location: 753859
Implantable Lead Model: 4195
Implantable Pulse Generator Implant Date: 20170213
Lead Channel Impedance Value: 342 Ohm
Lead Channel Impedance Value: 4047 Ohm
Lead Channel Impedance Value: 4047 Ohm
Lead Channel Impedance Value: 456 Ohm
Lead Channel Impedance Value: 532 Ohm
Lead Channel Pacing Threshold Amplitude: 0.5 V
Lead Channel Pacing Threshold Amplitude: 0.75 V
Lead Channel Pacing Threshold Pulse Width: 0.4 ms
Lead Channel Pacing Threshold Pulse Width: 1 ms
Lead Channel Sensing Intrinsic Amplitude: 15.625 mV
Lead Channel Sensing Intrinsic Amplitude: 4.5 mV
Lead Channel Setting Pacing Amplitude: 1.25 V
Lead Channel Setting Pacing Amplitude: 1.5 V
Lead Channel Setting Pacing Amplitude: 1.5 V
Lead Channel Setting Pacing Pulse Width: 0.4 ms
Lead Channel Setting Pacing Pulse Width: 1 ms
MDC IDC LEAD IMPLANT DT: 20110606
MDC IDC LEAD LOCATION: 753860
MDC IDC MSMT BATTERY REMAINING LONGEVITY: 59 mo
MDC IDC MSMT LEADCHNL RA IMPEDANCE VALUE: 475 Ohm
MDC IDC MSMT LEADCHNL RA PACING THRESHOLD PULSEWIDTH: 0.4 ms
MDC IDC MSMT LEADCHNL RV PACING THRESHOLD AMPLITUDE: 0.75 V
MDC IDC SET LEADCHNL RV SENSING SENSITIVITY: 0.3 mV
MDC IDC STAT BRADY RA PERCENT PACED: 89.78 %
MDC IDC STAT BRADY RV PERCENT PACED: 89.02 %

## 2017-12-10 MED ORDER — SACUBITRIL-VALSARTAN 49-51 MG PO TABS: 1.0000 | ORAL_TABLET | Freq: Two times a day (BID) | ORAL | 0 refills | Status: DC

## 2017-12-10 NOTE — Patient Instructions (Addendum)
Medication Instructions:  Your physician has recommended you make the following change in your medication:  1. INCREASE Entresto to 49/51 mg twice daily  Labwork: None ordered  Testing/Procedures: None ordered  Follow-Up: No follow up is needed at this time with Dr. Curt Bears.  He will see you on an as needed basis.  Make sure to follow up with [facility]  * If you need a refill on your cardiac medications before your next appointment, please call your pharmacy.   *Please note that any paperwork needing to be filled out by the provider will need to be addressed at the front desk prior to seeing the provider. Please note that any FMLA, disability or other documents regarding health condition is subject to a $25.00 charge that must be received prior to completion of paperwork in the form of a money order or check.  Thank you for choosing [facility]!!   Trinidad Curet, RN [phone]  Any Other Special Instructions Will Be Listed Below (If Applicable).

## 2017-12-10 NOTE — Progress Notes (Signed)
Electrophysiology Office Note   Date:  12/10/2017   ID:  Henry Moreno, DOB 07-May-1951, MRN [id number]  PCP:  [name], MD  Cardiologist:  [facility] Primary Electrophysiologist: [name]    Chief Complaint  Patient presents with  . Defib Check    VF/VT     History of Present Illness: Henry Moreno is a 67 y.o. male who is being seen today for the evaluation of chronic systolic heart failure, VT at the request of No ref. provider found. Presenting today for electrophysiology evaluation.  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  He was admitted to [facility] on 10/15/2017 after an ICD shock.  Patient initially presented with chest pain.  He was found to be in ventricular tachycardia below his detection zone.  His VT degenerated into polymorphic VT and thus he was converted to sinus rhythm with one shock.  His VT zone was reprogrammed.  He was started on IV amiodarone.  Left heart cath showed nonobstructive coronary disease.  He was initially put on IV amiodarone which was transitioned to p.o.  He remained chest pain-free throughout his hospitalization.  His ARB was changed to [name].     Today, he denies symptoms of palpitations, chest pain, shortness of breath, orthopnea, PND, lower extremity edema, claudication, dizziness, presyncope, syncope, bleeding, or neurologic sequela. The patient is tolerating medications without difficulties.  Had no further symptoms.  He has not noted any more ventricular tachycardia or atrial fibrillation.   Past Medical History:  Diagnosis Date  . Atrial fibrillation ([place])   . Cardiac arrest ([place])   . Chronic pain    neck   . Hypertension   . ICD (implantable cardioverter-defibrillator) in place   . NICM (nonischemic cardiomyopathy) [facility])    Past  Surgical History:  Procedure Laterality Date  . LEFT HEART CATH AND CORONARY ANGIOGRAPHY N/A 11/14/2017   Procedure: LEFT HEART CATH AND CORONARY ANGIOGRAPHY;  Surgeon: [name], MD;  Location: [place] CV [facility];  Service: Cardiovascular;  Laterality: N/A;  . ULTRASOUND GUIDANCE FOR VASCULAR ACCESS  11/14/2017   Procedure: Ultrasound Guidance For Vascular Access;  Surgeon: [name], MD;  Location: [place] CV LAB;  Service: Cardiovascular;;     Current Outpatient Medications  Medication Sig Dispense Refill  . acetaminophen (TYLENOL) 500 MG tablet Take 1,000 mg by mouth 2 (two) times daily.    [name] amiodarone (PACERONE) 200 MG tablet Take 200 mg by mouth at bedtime.    [name] amiodarone (PACERONE) 200 MG tablet Take 2 tablets (400 mg total) by mouth 2 (two) times daily. START  by taking 400mg  (2 tabs) twice daily for 2 weeks then reduce to 200mg  (1 tab) twice daily for 2 weeks, then reduce back to your usual 200mg  daily 84 tablet 0  . apixaban (ELIQUIS) 5 MG TABS tablet Take 5 mg by mouth 2 (two) times daily.     [name] azelastine (ASTELIN) 0.1 % nasal spray Place 1 spray into both nostrils as needed.  5  . benzonatate (TESSALON) 200 MG capsule Take 200 mg by mouth daily as needed.  5  . carvedilol (COREG) 25 MG tablet Take 1 tablet (25 mg total) by mouth 2 (two) times daily with a meal. 60 tablet 1  . cetirizine (ZYRTEC) 10 MG tablet Take 10 mg by mouth at bedtime.     [name]  Cholecalciferol (VITAMIN D3) 3000 units TABS Take 1 capsule by mouth daily. Taking a  2000mg  dose    . furosemide (LASIX) 20 MG tablet Take 1 tablet (20 mg total) by mouth daily as needed for fluid or edema (for weight gain of 3pounds in 24 hours or 5 pound in a week as discussed). 30 tablet 1  . Magnesium 200 MG TABS Take 200 mg by mouth at bedtime.     . sacubitril-valsartan (ENTRESTO) 24-26 MG Take 1 tablet by mouth 2 (two) times daily. 60 tablet 1   No current facility-administered medications for this visit.      Allergies:   Ace inhibitors and Ibuprofen   Social History:  The patient  reports that he has never smoked. He has never used smokeless tobacco. He reports that he does not drink alcohol or use drugs.   Family History:  The patient's family history is not on file. He was adopted.    ROS:  Please see the history of present illness.   Otherwise, review of systems is positive for none.   All other systems are reviewed and negative.    PHYSICAL EXAM: VS:  BP 130/82   Pulse 65   Ht 5\' 7"  (1.702 m)   Wt 244 lb 12.8 oz (111 kg)   SpO2 93%   BMI 38.34 kg/m  , BMI Body mass index is 38.34 kg/m. GEN: Well nourished, well developed, in no acute distress  HEENT: normal  Neck: no JVD, carotid bruits, or masses Cardiac: RRR; no murmurs, rubs, or gallops,no edema  Respiratory:  clear to auscultation bilaterally, normal work of breathing GI: soft, nontender, nondistended, + BS MS: no deformity or atrophy  Skin: warm and dry, device pocket is well healed Neuro:  Strength and sensation are intact Psych: euthymic mood, full affect  EKG:  EKG is ordered today. Personal review of the ekg ordered shows AV paced, PVCs  Device interrogation is reviewed today in detail.  See PaceArt for details.   Recent Labs: 11/12/2017: Hemoglobin 14.1; Magnesium 2.3; Platelets 211 11/15/2017: BUN 17; Creatinine, Ser 1.09; Potassium 3.7; Sodium 138    Lipid Panel  No results found for: CHOL, TRIG, HDL, CHOLHDL, VLDL, LDLCALC, LDLDIRECT   Wt Readings from Last 3 Encounters:  12/10/17 244 lb 12.8 oz (111 kg)  11/15/17 235 lb 9.6 oz (106.9 kg)  11/10/15 227 lb (103 kg)      Other studies Reviewed: Additional studies/ records that were reviewed today include: TTE 11/13/17  Review of the above records today demonstrates:  - Left ventricle: The cavity size was mildly dilated. Wall   thickness was normal. Systolic function was severely reduced. The   estimated ejection fraction was in the range of 25%  to 30%.   Severe diffuse hypokinesis with no identifiable regional   variations. Features are consistent with a pseudonormal left   ventricular filling pattern, with concomitant abnormal relaxation   and increased filling pressure (grade 2 diastolic dysfunction).   No evidence of thrombus. - Ventricular septum: Septal motion showed paradox. These changes   are consistent with intraventricular conduction delay. - Mitral valve: There was mild regurgitation directed eccentrically   and posteriorly. - Left atrium: The atrium was moderately to severely dilated.  LHC 11/14/17  Mid RCA lesion is 25% stenosed.  Mid LAD lesion is 25% stenosed.  There is severe left ventricular systolic dysfunction.  LV end diastolic pressure is mildly elevated.  There is no aortic valve stenosis.  The left ventricular  ejection fraction is less than 25% by visual estimate.   ASSESSMENT AND PLAN:  1.  Nonischemic cardiomyopathy: Currently on optimal medical therapy.  We started him on Entresto and increased carvedilol at his last hospitalization.  We will increase today.  He wishes to follow-up with [facility] cardiology.  2.  Ventricular tachycardia:  VT zones have been adjusted to incorporate slower rates of tachycardia.  Currently on amiodarone.  Has also been switched to [facility] from his ARB.  No further episodes of ventricular tachycardia.  No driving in [place] for 6 months after his event.  3.  Coronary artery disease: Nonobstructive by cath.  No current chest pain.  4.  Paroxysmal atrial fibrillation: Currently on Eliquis and amiodarone.  No changes.  This patients CHA2DS2-VASc Score and unadjusted Ischemic Stroke Rate (% per year) is equal to 4.8 % stroke rate/year from a score of 4  Above score calculated as 1 point each if present [CHF, HTN, DM, Vascular=MI/PAD/Aortic Plaque, Age if 65-74, or Male] Above score calculated as 2 points each if present [Age > 75, or Stroke/TIA/TE]  5.   Hypertension: Blood pressure well controlled today.  No changes.  Current medicines are reviewed at length with the patient today.   The patient does not have concerns regarding his medicines.  The following changes were made today:   Increase Entresto  Labs/ tests ordered today include:  Orders Placed This Encounter  Procedures  . EKG 12-Lead     Disposition:   FU with Will Camnitz PRN  Signed, Will Meredith Leeds, MD  12/10/2017 4:00 PM     [place] [address] [phone] (office) [phone] (fax)

## 2017-12-22 ENCOUNTER — Other Ambulatory Visit: Payer: Self-pay | Admitting: Physician Assistant

## 2017-12-24 ENCOUNTER — Other Ambulatory Visit: Payer: Self-pay | Admitting: Physician Assistant

## 2017-12-24 NOTE — Telephone Encounter (Signed)
Should this be deferred to physician at [facility]? Please advise. Thanks, MI

## 2017-12-26 NOTE — Telephone Encounter (Signed)
Ok to refill a 90 day supply for patient. I spoke with him and he will establishing w/ [facility] soon.  He understands we will send a 90 day supply to pharmacy. Thx

## 2017-12-27 ENCOUNTER — Telehealth: Payer: Self-pay | Admitting: Cardiology

## 2017-12-27 MED ORDER — SACUBITRIL-VALSARTAN 49-51 MG PO TABS: 1.0000 | ORAL_TABLET | Freq: Two times a day (BID) | ORAL | 1 refills | Status: DC

## 2017-12-27 NOTE — Telephone Encounter (Signed)
Rx updated and sent to pharmacy. Pharmacy notified.

## 2017-12-27 NOTE — Telephone Encounter (Signed)
New message:      Pt c/o medication issue:  1. Name of Medication: sacubitril-valsartan (ENTRESTO) 49-51 MG  2. How are you currently taking this medication (dosage and times per day)?  Take 1 tablet by mouth 2 (two) times daily. 3. Are you having a reaction (difficulty breathing--STAT)? No  4. What is your medication issue? [facility] calling to see if prescription quantity can be 180 instead of 90

## 2017-12-30 DIAGNOSIS — I472 Ventricular tachycardia, unspecified: Secondary | ICD-10-CM | POA: Insufficient documentation

## 2020-06-30 DIAGNOSIS — E611 Iron deficiency: Secondary | ICD-10-CM | POA: Insufficient documentation

## 2020-08-26 ENCOUNTER — Other Ambulatory Visit
Admission: RE | Admit: 2020-08-26 | Discharge: 2020-08-26 | Disposition: A | Discharge status: Home / Self Care | Payer: 59 | Source: Ambulatory Visit | Attending: Internal Medicine | Admitting: Internal Medicine

## 2020-08-26 ENCOUNTER — Other Ambulatory Visit: Payer: Self-pay

## 2020-08-26 ENCOUNTER — Encounter: Payer: Self-pay | Admitting: Internal Medicine

## 2020-08-26 DIAGNOSIS — Z20822 Contact with and (suspected) exposure to covid-19: Secondary | ICD-10-CM | POA: Diagnosis not present

## 2020-08-26 DIAGNOSIS — Z01812 Encounter for preprocedural laboratory examination: Secondary | ICD-10-CM | POA: Insufficient documentation

## 2020-08-26 LAB — SARS CORONAVIRUS 2 (TAT 6-24 HRS): SARS Coronavirus 2: NEGATIVE

## 2020-08-27 ENCOUNTER — Other Ambulatory Visit: Payer: 59

## 2020-08-30 ENCOUNTER — Ambulatory Visit
Admission: RE | Admit: 2020-08-30 | Discharge: 2020-08-30 | Disposition: A | Discharge status: Home / Self Care | Payer: 59 | Attending: Internal Medicine | Admitting: Internal Medicine

## 2020-08-30 ENCOUNTER — Encounter: Payer: Self-pay | Admitting: Internal Medicine

## 2020-08-30 ENCOUNTER — Ambulatory Visit: Payer: 59 | Admitting: Certified Registered"

## 2020-08-30 ENCOUNTER — Other Ambulatory Visit: Payer: Self-pay

## 2020-08-30 ENCOUNTER — Encounter
Admission: RE | Disposition: A | Discharge status: Home / Self Care | Payer: Self-pay | Source: Home / Self Care | Attending: Internal Medicine

## 2020-08-30 DIAGNOSIS — Z95810 Presence of automatic (implantable) cardiac defibrillator: Secondary | ICD-10-CM | POA: Diagnosis not present

## 2020-08-30 DIAGNOSIS — Z886 Allergy status to analgesic agent status: Secondary | ICD-10-CM | POA: Diagnosis not present

## 2020-08-30 DIAGNOSIS — K640 First degree hemorrhoids: Secondary | ICD-10-CM | POA: Diagnosis not present

## 2020-08-30 DIAGNOSIS — D123 Benign neoplasm of transverse colon: Secondary | ICD-10-CM | POA: Diagnosis not present

## 2020-08-30 DIAGNOSIS — K449 Diaphragmatic hernia without obstruction or gangrene: Secondary | ICD-10-CM | POA: Diagnosis not present

## 2020-08-30 DIAGNOSIS — Z7901 Long term (current) use of anticoagulants: Secondary | ICD-10-CM | POA: Diagnosis not present

## 2020-08-30 DIAGNOSIS — K5730 Diverticulosis of large intestine without perforation or abscess without bleeding: Secondary | ICD-10-CM | POA: Diagnosis not present

## 2020-08-30 DIAGNOSIS — D500 Iron deficiency anemia secondary to blood loss (chronic): Secondary | ICD-10-CM | POA: Insufficient documentation

## 2020-08-30 DIAGNOSIS — K921 Melena: Secondary | ICD-10-CM | POA: Insufficient documentation

## 2020-08-30 DIAGNOSIS — D125 Benign neoplasm of sigmoid colon: Secondary | ICD-10-CM | POA: Diagnosis not present

## 2020-08-30 DIAGNOSIS — Z79899 Other long term (current) drug therapy: Secondary | ICD-10-CM | POA: Insufficient documentation

## 2020-08-30 HISTORY — DX: Heart failure, unspecified: I50.9

## 2020-08-30 HISTORY — DX: Anemia, unspecified: D64.9

## 2020-08-30 HISTORY — DX: Other cervical disc degeneration, unspecified cervical region: M50.30

## 2020-08-30 HISTORY — PX: COLONOSCOPY: SHX5424

## 2020-08-30 HISTORY — PX: ESOPHAGOGASTRODUODENOSCOPY (EGD) WITH PROPOFOL: SHX5813

## 2020-08-30 HISTORY — DX: Sleep apnea, unspecified: G47.30

## 2020-08-30 HISTORY — DX: Unspecified asthma, uncomplicated: J45.909

## 2020-08-30 SURGERY — ESOPHAGOGASTRODUODENOSCOPY (EGD) WITH PROPOFOL
Anesthesia: General

## 2020-08-30 MED ORDER — SPOT INK MARKER SYRINGE KIT
PACK | SUBMUCOSAL | Status: DC | PRN
  Administered 2020-08-30: 2 mL via SUBMUCOSAL

## 2020-08-30 MED ORDER — GLYCOPYRROLATE 0.2 MG/ML IJ SOLN
INTRAMUSCULAR | Status: DC | PRN
  Administered 2020-08-30: .2 mg via INTRAVENOUS

## 2020-08-30 MED ORDER — PROPOFOL 10 MG/ML IV BOLUS
INTRAVENOUS | Status: DC | PRN
  Administered 2020-08-30: 10 mg via INTRAVENOUS
  Administered 2020-08-30: 50 mg via INTRAVENOUS

## 2020-08-30 MED ORDER — PROPOFOL 500 MG/50ML IV EMUL
INTRAVENOUS | Status: DC | PRN
  Administered 2020-08-30: 145 ug/kg/min via INTRAVENOUS

## 2020-08-30 MED ORDER — SODIUM CHLORIDE 0.9 % IV SOLN: INTRAVENOUS | Status: DC

## 2020-08-30 MED ORDER — LIDOCAINE HCL (CARDIAC) PF 100 MG/5ML IV SOSY
PREFILLED_SYRINGE | INTRAVENOUS | Status: DC | PRN
  Administered 2020-08-30: 100 mg via INTRAVENOUS

## 2020-08-30 NOTE — Op Note (Signed)
[facility] Gastroenterology Patient Name: Henry Moreno Procedure Date: 08/30/2020 9:33 AM MRN: [id number] Account #: [id number] Date of Birth: 1951/02/24 Admit Type: Outpatient Age: 70 Room: [facility] ENDO ROOM 2 Gender: Male Note Status: Finalized Procedure:             Upper GI endoscopy Indications:           Iron deficiency anemia secondary to chronic blood loss Providers:             Boykin Nearing. Norma Fredrickson MD, MD Referring MD:          Daniel Nones, MD (Referring MD) Medicines:             Propofol per Anesthesia Complications:         No immediate complications. Procedure:             Pre-Anesthesia Assessment:                        - The risks and benefits of the procedure and the                         sedation options and risks were discussed with the                         patient. All questions were answered and informed                         consent was obtained.                        - Patient identification and proposed procedure were                         verified prior to the procedure by the nurse. The                         procedure was verified in the procedure room.                        - ASA Grade Assessment: III - A patient with severe                         systemic disease.                        - After reviewing the risks and benefits, the patient                         was deemed in satisfactory condition to undergo the                         procedure.                        After obtaining informed consent, the endoscope was                         passed under direct vision. Throughout the procedure,  the patient's blood pressure, pulse, and oxygen                         saturations were monitored continuously. The Endoscope                         was introduced through the mouth, and advanced to the                         third part of duodenum. The upper GI endoscopy was                          accomplished without difficulty. The patient tolerated                         the procedure well. Findings:      The esophagus was normal.      A 1 cm hiatal hernia was present.      The examined duodenum was normal.      The exam was otherwise without abnormality. Impression:            - Normal esophagus.                        - 1 cm hiatal hernia.                        - Normal examined duodenum.                        - The examination was otherwise normal.                        - No specimens collected. Recommendation:        - Proceed with colonoscopy Procedure Code(s):     --- Professional ---                        [phone], Esophagogastroduodenoscopy, flexible,                         transoral; diagnostic, including collection of                         specimen(s) by brushing or washing, when performed                         (separate procedure) Diagnosis Code(s):     --- Professional ---                        D50.0, Iron deficiency anemia secondary to blood loss                         (chronic)                        K44.9, Diaphragmatic hernia without obstruction or                         gangrene CPT copyright 2019 American Medical Association. All rights reserved. The codes documented in this report are preliminary and  upon coder review may  be revised to meet current compliance requirements. Efrain Sella MD, MD 08/30/2020 9:43:16 AM This report has been signed electronically. Number of Addenda: 0 Note Initiated On: 08/30/2020 9:33 AM Estimated Blood Loss:  Estimated blood loss: none.      [facility]

## 2020-08-30 NOTE — Anesthesia Procedure Notes (Signed)
Procedure Name: General with mask airway Performed by: Fletcher-Harrison, Audri Kozub, CRNA Pre-anesthesia Checklist: Patient identified, Emergency Drugs available, Suction available and Patient being monitored Patient Re-evaluated:Patient Re-evaluated prior to induction Oxygen Delivery Method: Circle system utilized Induction Type: IV induction Placement Confirmation: positive ETCO2 and CO2 detector Dental Injury: Teeth and Oropharynx as per pre-operative assessment        

## 2020-08-30 NOTE — Anesthesia Postprocedure Evaluation (Signed)
Anesthesia Post Note  Patient: Jamaine Quintin  Procedure(s) Performed: ESOPHAGOGASTRODUODENOSCOPY (EGD) WITH PROPOFOL (N/A ) COLONOSCOPY (N/A )  Patient location during evaluation: Endoscopy Anesthesia Type: General Level of consciousness: awake and alert Pain management: pain level controlled Vital Signs Assessment: post-procedure vital signs reviewed and stable Respiratory status: spontaneous breathing, nonlabored ventilation, respiratory function stable and patient connected to nasal cannula oxygen Cardiovascular status: blood pressure returned to baseline and stable Postop Assessment: no apparent nausea or vomiting Anesthetic complications: no   No complications documented.   Last Vitals:  Vitals:   08/30/20 1020 08/30/20 1030  BP: (!) 86/54 98/65  Pulse: 61 60  Resp: (!) 23 (!) 22  Temp:    SpO2: 98% 99%    Last Pain:  Vitals:   08/30/20 1010  TempSrc: Temporal                 Corinda Gubler

## 2020-08-30 NOTE — Op Note (Signed)
[facility] Gastroenterology Patient Name: Henry Moreno Procedure Date: 08/30/2020 9:32 AM MRN: [id number] Account #: [id number] Date of Birth: 1951/05/30 Admit Type: Outpatient Age: 70 Room: [facility] ENDO ROOM 2 Gender: Male Note Status: Finalized Procedure:             Colonoscopy Indications:           Iron deficiency anemia secondary to chronic blood loss Providers:             Benay Pike. Alice Reichert MD, MD Referring MD:          Ramonita Lab, MD (Referring MD) Medicines:             Propofol per Anesthesia Complications:         No immediate complications. Estimated blood loss: None. Procedure:             Pre-Anesthesia Assessment:                        - The risks and benefits of the procedure and the                         sedation options and risks were discussed with the                         patient. All questions were answered and informed                         consent was obtained.                        - Patient identification and proposed procedure were                         verified prior to the procedure by the nurse. The                         procedure was verified in the procedure room.                        - ASA Grade Assessment: III - A patient with severe                         systemic disease.                        - After reviewing the risks and benefits, the patient                         was deemed in satisfactory condition to undergo the                         procedure.                        After obtaining informed consent, the colonoscope was                         passed under direct vision. Throughout the procedure,  the patient's blood pressure, pulse, and oxygen                         saturations were monitored continuously. The                         Colonoscope was introduced through the anus and                         advanced to the the cecum, identified by appendiceal                          orifice and ileocecal valve. The colonoscopy was                         performed without difficulty. The patient tolerated                         the procedure well. The quality of the bowel                         preparation was good. The ileocecal valve, appendiceal                         orifice, and rectum were photographed. Findings:      The perianal and digital rectal examinations were normal. Pertinent       negatives include normal sphincter tone and no palpable rectal lesions.      Non-bleeding internal hemorrhoids were found during retroflexion. The       hemorrhoids were Grade II (internal hemorrhoids that prolapse but reduce       spontaneously).      Multiple small and large-mouthed diverticula were found in the left       colon. There was no evidence of diverticular bleeding.      A 8 mm polyp was found in the transverse colon. The polyp was sessile.       The polyp was removed with a hot snare. Resection and retrieval were       complete.      A 4 mm polyp was found in the sigmoid colon. The polyp was sessile. The       polyp was removed with a jumbo cold forceps. Resection and retrieval       were complete.      A 12 mm polyp was found in the sigmoid colon. The polyp was       pedunculated. The polyp was removed with a piecemeal technique using a       hot snare. Resection and retrieval were complete.      A 30 mm polyp was found in the distal sigmoid colon. The polyp was       pedunculated. The polyp was removed with a hot snare. Resection and       retrieval were complete. To prevent bleeding after the polypectomy, one       hemostatic clip was successfully placed (MR conditional). There was no       bleeding during, or at the end, of the procedure. Area was successfully       injected with 3 mL Spot (carbon black) for tattooing.      The exam was otherwise without abnormality. Impression:            -  Non-bleeding internal hemorrhoids.                         - Moderate diverticulosis in the left colon. There was                         no evidence of diverticular bleeding.                        - One 8 mm polyp in the transverse colon, removed with                         a hot snare. Resected and retrieved.                        - One 4 mm polyp in the sigmoid colon, removed with a                         jumbo cold forceps. Resected and retrieved.                        - One 12 mm polyp in the sigmoid colon, removed                         piecemeal using a hot snare. Resected and retrieved.                        - One 30 mm polyp in the distal sigmoid colon, removed                         with a hot snare. Resected and retrieved. Clip (MR                         conditional) was placed. Injected.                        - The examination was otherwise normal. Recommendation:        - Patient has a contact number available for                         emergencies. The signs and symptoms of potential                         delayed complications were discussed with the patient.                         Return to normal activities tomorrow. Written                         discharge instructions were provided to the patient.                        - Resume previous diet.                        - Continue present medications.                        -  Repeat colonoscopy is recommended for surveillance.                         The colonoscopy date will be determined after                         pathology results from today's exam become available                         for review.                        - Return to GI office in 2 months.                        - To visualize the small bowel, perform video capsule                         endoscopy at appointment to be scheduled.                        - Follow up with Octavia Bruckner, PA-C in [ ]  months.                        - The findings and recommendations were discussed with                          the patient. Procedure Code(s):     --- Professional ---                        [phone], Colonoscopy, flexible; with removal of                         tumor(s), polyp(s), or other lesion(s) by snare                         technique                        45380, 52, Colonoscopy, flexible; with biopsy, single                         or multiple                        45381, Colonoscopy, flexible; with directed submucosal                         injection(s), any substance Diagnosis Code(s):     --- Professional ---                        K57.30, Diverticulosis of large intestine without                         perforation or abscess without bleeding                        D50.0, Iron deficiency anemia secondary to blood loss                         (  chronic)                        K63.5, Polyp of colon                        K64.0, First degree hemorrhoids CPT copyright 2019 American Medical Association. All rights reserved. The codes documented in this report are preliminary and upon coder review may  be revised to meet current compliance requirements. Efrain Sella MD, MD 08/30/2020 10:22:09 AM This report has been signed electronically. Number of Addenda: 0 Note Initiated On: 08/30/2020 9:32 AM Scope Withdrawal Time: 0 hours 22 minutes 20 seconds  Total Procedure Duration: 0 hours 25 minutes 56 seconds  Estimated Blood Loss:  Estimated blood loss: none. Estimated blood loss: none.      [facility]

## 2020-08-30 NOTE — Anesthesia Preprocedure Evaluation (Addendum)
Anesthesia Evaluation  Patient identified by MRN, date of birth, ID band Patient awake    Reviewed: Allergy & Precautions, NPO status , Patient's Chart, lab work & pertinent test results  History of Anesthesia Complications Negative for: history of anesthetic complications  Airway Mallampati: II  TM Distance: >3 FB Neck ROM: Full    Dental no notable dental hx. (+) Teeth Intact   Pulmonary asthma , sleep apnea and Continuous Positive Airway Pressure Ventilation , neg COPD, Patient abstained from smoking.Not current smoker,    Pulmonary exam normal breath sounds clear to auscultation       Cardiovascular Exercise Tolerance: Good METShypertension, + CAD and +CHF  (-) Past MI (-) dysrhythmias + Cardiac Defibrillator  Rhythm:Regular Rate:Normal - Systolic murmurs TTE 2019: - Left ventricle: The cavity size was mildly dilated. Wall  thickness was normal. Systolic function was severely reduced. The  estimated ejection fraction was in the range of 25% to 30%.  Severe diffuse hypokinesis with no identifiable regional  variations. Features are consistent with a pseudonormal left  ventricular filling pattern, with concomitant abnormal relaxation  and increased filling pressure (grade 2 diastolic dysfunction).  No evidence of thrombus.  - Ventricular septum: Septal motion showed paradox. These changes  are consistent with intraventricular conduction delay.  - Mitral valve: There was mild regurgitation directed eccentrically  and posteriorly.  - Left atrium: The atrium was moderately to severely dilated.    Neuro/Psych negative neurological ROS  negative psych ROS   GI/Hepatic neg GERD  ,(+)     (-) substance abuse  ,   Endo/Other  neg diabetes  Renal/GU negative Renal ROS     Musculoskeletal   Abdominal   Peds  Hematology   Anesthesia Other Findings Past Medical History: No date: Anemia No date:  Asthma No date: Atrial fibrillation (HCC) No date: Cardiac arrest (HCC) No date: CHF (congestive heart failure) (HCC) No date: Chronic pain     Comment:  neck  No date: DDD (degenerative disc disease), cervical No date: Hypertension No date: ICD (implantable cardioverter-defibrillator) in place No date: NICM (nonischemic cardiomyopathy) (HCC) No date: Sleep apnea  Reproductive/Obstetrics                           Anesthesia Physical Anesthesia Plan  ASA: III  Anesthesia Plan: General   Post-op Pain Management:    Induction: Intravenous  PONV Risk Score and Plan: 2 and Ondansetron, Propofol infusion and TIVA  Airway Management Planned: Nasal Cannula  Additional Equipment: None  Intra-op Plan:   Post-operative Plan:   Informed Consent: I have reviewed the patients History and Physical, chart, labs and discussed the procedure including the risks, benefits and alternatives for the proposed anesthesia with the patient or authorized representative who has indicated his/her understanding and acceptance.     Dental advisory given  Plan Discussed with: CRNA and Surgeon  Anesthesia Plan Comments: (Discussed risks of anesthesia with patient, including possibility of difficulty with spontaneous ventilation under anesthesia necessitating airway intervention, PONV, and rare risks such as cardiac or respiratory or neurological events. Patient understands.)       Anesthesia Quick Evaluation

## 2020-08-30 NOTE — Transfer of Care (Addendum)
Immediate Anesthesia Transfer of Care Note  Patient: Henry Moreno  Procedure(s) Performed: ESOPHAGOGASTRODUODENOSCOPY (EGD) WITH PROPOFOL (N/A ) COLONOSCOPY (N/A )  Patient Location: Endoscopy Unit  Anesthesia Type:General  Level of Consciousness: awake, drowsy and patient cooperative  Airway & Oxygen Therapy: Patient Spontanous Breathing and Patient connected to face mask oxygen  Post-op Assessment: Report given to RN and Post -op Vital signs reviewed and stable  Post vital signs: Reviewed and stable  Last Vitals:  Vitals Value Taken Time  BP 86/54 08/30/20 1017  Temp    Pulse 64 08/30/20 1022  Resp 21 08/30/20 1022  SpO2 98 % 08/30/20 1022  Vitals shown include unvalidated device data.  Last Pain:  Vitals:   08/30/20 1010  TempSrc: Temporal         Complications: No complications documented.

## 2020-08-30 NOTE — H&P (Signed)
Outpatient short stay form Pre-procedure 08/30/2020 9:18 AM Zyairah Wacha K. Alice Reichert, M.D.  Primary Physician: Ramonita Lab III, M.D.  Reason for visit:  Iron deficiency anemia, heme positive stool, hematochezia  History of present illness: Patient presents for diagnosis of progressive anemia and found to have iron deficiency. Has no complaints of upper symptoms such as anorexia, abdominal pain, severe GERD, dysphagia, hemetemesis, melena, nausea or vomiting. Patient denies change in bowel habits, weight loss or abdominal pain.  He admits to occasional BRBPR.     Current Facility-Administered Medications:  .  0.9 %  sodium chloride infusion, , Intravenous, Continuous, Trenden Hazelrigg, Benay Pike, MD  Medications Prior to Admission  Medication Sig Dispense Refill Last Dose  . amiodarone (PACERONE) 200 MG tablet Take 1 tablet (200 mg total) by mouth daily. 90 tablet 3 08/29/2020 at Unknown time  . benzonatate (TESSALON) 200 MG capsule Take 200 mg by mouth 3 (three) times daily as needed for cough.  5 Past Week at Unknown time  . carvedilol (COREG) 25 MG tablet TAKE 1 TABLET(25 MG) BY MOUTH TWICE DAILY WITH A MEAL 180 tablet 0 08/30/2020 at Unknown time  . cetirizine (ZYRTEC) 10 MG tablet Take 10 mg by mouth at bedtime.    Past Week at Unknown time  . Cholecalciferol (VITAMIN D3) 3000 units TABS Take 1 capsule by mouth daily. Taking a  2000mg  dose   Past Week at Unknown time  . Magnesium 200 MG TABS Take 200 mg by mouth at bedtime.    Past Week at Unknown time  . sacubitril-valsartan (ENTRESTO) 49-51 MG Take 1 tablet by mouth 2 (two) times daily. 180 tablet 1 08/30/2020 at Unknown time  . acetaminophen (TYLENOL) 500 MG tablet Take 1,000 mg by mouth 2 (two) times daily.     [name] amiodarone (PACERONE) 200 MG tablet Take 200 mg by mouth at bedtime.     [name] amiodarone (PACERONE) 200 MG tablet Take 2 tablets (400 mg total) by mouth 2 (two) times daily. START  by taking 400mg  (2 tabs) twice daily for 2 weeks then reduce to 200mg   (1 tab) twice daily for 2 weeks, then reduce back to your usual 200mg  daily 84 tablet 0   . apixaban (ELIQUIS) 5 MG TABS tablet Take 5 mg by mouth 2 (two) times daily.    08/27/2020  . azelastine (ASTELIN) 0.1 % nasal spray Place 1 spray into both nostrils as needed.  5   . furosemide (LASIX) 20 MG tablet Take 1 tablet (20 mg total) by mouth daily as needed for fluid or edema (for weight gain of 3pounds in 24 hours or 5 pound in a week as discussed). 30 tablet 1      Allergies  Allergen Reactions  . Ace Inhibitors Cough  . Ibuprofen Other (See Comments)    Other Reaction: HIVES / RASH     Past Medical History:  Diagnosis Date  . Anemia   . Asthma   . Atrial fibrillation ([place])   . Cardiac arrest ([place])   . CHF (congestive heart failure) ([place])   . Chronic pain    neck   . DDD (degenerative disc disease), cervical   . Hypertension   . ICD (implantable cardioverter-defibrillator) in place   . NICM (nonischemic cardiomyopathy) ([place])   . Sleep apnea     Review of systems:  Otherwise negative.    Physical Exam  Gen: Alert, oriented. Appears stated age.  HEENT: [place]/AT. PERRLA. Lungs: CTA, no wheezes. CV: RR nl S1, S2. Abd:  soft, benign, no masses. BS+ Ext: No edema. Pulses 2+    Planned procedures: Proceed with EGD and colonoscopy. The patient understands the nature of the planned procedure, indications, risks, alternatives and potential complications including but not limited to bleeding, infection, perforation, damage to internal organs and possible oversedation/side effects from anesthesia. The patient agrees and gives consent to proceed.  Please refer to procedure notes for findings, recommendations and patient disposition/instructions.     Loie Jahr K. Norma Fredrickson, M.D. Gastroenterology 08/30/2020  9:18 AM

## 2020-08-30 NOTE — Interval H&P Note (Signed)
History and Physical Interval Note:  08/30/2020 9:20 AM  Henry Moreno  has presented today for surgery, with the diagnosis of HEME+STOOLS,LOW FERRITIN LEVEL,IDA.  The various methods of treatment have been discussed with the patient and family. After consideration of risks, benefits and other options for treatment, the patient has consented to  Procedure(s): ESOPHAGOGASTRODUODENOSCOPY (EGD) WITH PROPOFOL (N/A) COLONOSCOPY (N/A) as a surgical intervention.  The patient's history has been reviewed, patient examined, no change in status, stable for surgery.  I have reviewed the patient's chart and labs.  Questions were answered to the patient's satisfaction.     [place], [place]

## 2020-09-01 ENCOUNTER — Encounter: Payer: Self-pay | Admitting: Internal Medicine

## 2020-09-01 LAB — SURGICAL PATHOLOGY

## 2021-03-15 ENCOUNTER — Other Ambulatory Visit: Payer: Self-pay

## 2021-03-15 ENCOUNTER — Inpatient Hospital Stay
Admission: EM | Admit: 2021-03-15 | Discharge: 2021-03-19 | DRG: 204 | Disposition: A | Discharge status: Home / Self Care | Payer: 59 | Attending: Obstetrics and Gynecology | Admitting: Obstetrics and Gynecology

## 2021-03-15 ENCOUNTER — Emergency Department: Payer: 59

## 2021-03-15 DIAGNOSIS — I959 Hypotension, unspecified: Secondary | ICD-10-CM | POA: Diagnosis present

## 2021-03-15 DIAGNOSIS — I5042 Chronic combined systolic (congestive) and diastolic (congestive) heart failure: Secondary | ICD-10-CM | POA: Diagnosis present

## 2021-03-15 DIAGNOSIS — I110 Hypertensive heart disease with heart failure: Secondary | ICD-10-CM | POA: Diagnosis present

## 2021-03-15 DIAGNOSIS — I2510 Atherosclerotic heart disease of native coronary artery without angina pectoris: Secondary | ICD-10-CM | POA: Diagnosis present

## 2021-03-15 DIAGNOSIS — R9389 Abnormal findings on diagnostic imaging of other specified body structures: Secondary | ICD-10-CM

## 2021-03-15 DIAGNOSIS — Z6834 Body mass index (BMI) 34.0-34.9, adult: Secondary | ICD-10-CM | POA: Diagnosis not present

## 2021-03-15 DIAGNOSIS — J9601 Acute respiratory failure with hypoxia: Secondary | ICD-10-CM | POA: Diagnosis not present

## 2021-03-15 DIAGNOSIS — Z7901 Long term (current) use of anticoagulants: Secondary | ICD-10-CM | POA: Diagnosis not present

## 2021-03-15 DIAGNOSIS — Z79899 Other long term (current) drug therapy: Secondary | ICD-10-CM | POA: Diagnosis not present

## 2021-03-15 DIAGNOSIS — R042 Hemoptysis: Secondary | ICD-10-CM | POA: Diagnosis present

## 2021-03-15 DIAGNOSIS — G4733 Obstructive sleep apnea (adult) (pediatric): Secondary | ICD-10-CM | POA: Diagnosis present

## 2021-03-15 DIAGNOSIS — I480 Paroxysmal atrial fibrillation: Secondary | ICD-10-CM | POA: Diagnosis present

## 2021-03-15 DIAGNOSIS — I10 Essential (primary) hypertension: Secondary | ICD-10-CM | POA: Diagnosis present

## 2021-03-15 DIAGNOSIS — Z20822 Contact with and (suspected) exposure to covid-19: Secondary | ICD-10-CM | POA: Diagnosis present

## 2021-03-15 DIAGNOSIS — Z8674 Personal history of sudden cardiac arrest: Secondary | ICD-10-CM

## 2021-03-15 DIAGNOSIS — I4891 Unspecified atrial fibrillation: Secondary | ICD-10-CM | POA: Diagnosis present

## 2021-03-15 DIAGNOSIS — E785 Hyperlipidemia, unspecified: Secondary | ICD-10-CM | POA: Diagnosis present

## 2021-03-15 DIAGNOSIS — R0602 Shortness of breath: Secondary | ICD-10-CM

## 2021-03-15 DIAGNOSIS — J9621 Acute and chronic respiratory failure with hypoxia: Secondary | ICD-10-CM

## 2021-03-15 DIAGNOSIS — R0902 Hypoxemia: Secondary | ICD-10-CM

## 2021-03-15 DIAGNOSIS — Z95810 Presence of automatic (implantable) cardiac defibrillator: Secondary | ICD-10-CM

## 2021-03-15 DIAGNOSIS — E669 Obesity, unspecified: Secondary | ICD-10-CM | POA: Diagnosis present

## 2021-03-15 DIAGNOSIS — I4901 Ventricular fibrillation: Secondary | ICD-10-CM | POA: Diagnosis present

## 2021-03-15 DIAGNOSIS — J45909 Unspecified asthma, uncomplicated: Secondary | ICD-10-CM | POA: Diagnosis present

## 2021-03-15 DIAGNOSIS — T447X5A Adverse effect of beta-adrenoreceptor antagonists, initial encounter: Secondary | ICD-10-CM | POA: Diagnosis present

## 2021-03-15 DIAGNOSIS — J810 Acute pulmonary edema: Secondary | ICD-10-CM | POA: Diagnosis not present

## 2021-03-15 DIAGNOSIS — I5023 Acute on chronic systolic (congestive) heart failure: Secondary | ICD-10-CM | POA: Diagnosis not present

## 2021-03-15 LAB — APTT: aPTT: 35 seconds (ref 24–36)

## 2021-03-15 LAB — CBC WITH DIFFERENTIAL/PLATELET
Abs Immature Granulocytes: 0.05 10*3/uL (ref 0.00–0.07)
Basophils Absolute: 0.1 10*3/uL (ref 0.0–0.1)
Basophils Relative: 1 %
Eosinophils Absolute: 0.4 10*3/uL (ref 0.0–0.5)
Eosinophils Relative: 5 %
HCT: 38.8 % — ABNORMAL LOW (ref 39.0–52.0)
Hemoglobin: 12.6 g/dL — ABNORMAL LOW (ref 13.0–17.0)
Immature Granulocytes: 1 %
Lymphocytes Relative: 16 %
Lymphs Abs: 1.3 10*3/uL (ref 0.7–4.0)
MCH: 31.1 pg (ref 26.0–34.0)
MCHC: 32.5 g/dL (ref 30.0–36.0)
MCV: 95.8 fL (ref 80.0–100.0)
Monocytes Absolute: 0.5 10*3/uL (ref 0.1–1.0)
Monocytes Relative: 6 %
Neutro Abs: 5.7 10*3/uL (ref 1.7–7.7)
Neutrophils Relative %: 71 %
Platelets: 271 10*3/uL (ref 150–400)
RBC: 4.05 MIL/uL — ABNORMAL LOW (ref 4.22–5.81)
RDW: 15 % (ref 11.5–15.5)
WBC: 8.1 10*3/uL (ref 4.0–10.5)
nRBC: 0 % (ref 0.0–0.2)

## 2021-03-15 LAB — BASIC METABOLIC PANEL
Anion gap: 8 (ref 5–15)
BUN: 27 mg/dL — ABNORMAL HIGH (ref 8–23)
CO2: 22 mmol/L (ref 22–32)
Calcium: 8.6 mg/dL — ABNORMAL LOW (ref 8.9–10.3)
Chloride: 106 mmol/L (ref 98–111)
Creatinine, Ser: 0.92 mg/dL (ref 0.61–1.24)
GFR, Estimated: >60 mL/min (ref >60)
Glucose, Bld: 136 mg/dL — ABNORMAL HIGH (ref 70–99)
Potassium: 4.5 mmol/L (ref 3.5–5.1)
Sodium: 136 mmol/L (ref 135–145)

## 2021-03-15 LAB — PROTIME-INR
INR: 1.3 — ABNORMAL HIGH (ref 0.8–1.2)
Prothrombin Time: 16.6 seconds — ABNORMAL HIGH (ref 11.4–15.2)

## 2021-03-15 LAB — RESP PANEL BY RT-PCR (FLU A&B, COVID) ARPGX2
Influenza A by PCR: NEGATIVE
Influenza B by PCR: NEGATIVE
SARS Coronavirus 2 by RT PCR: NEGATIVE

## 2021-03-15 LAB — BRAIN NATRIURETIC PEPTIDE: B Natriuretic Peptide: 382.9 pg/mL — ABNORMAL HIGH (ref 0.0–100.0)

## 2021-03-15 LAB — TYPE AND SCREEN
ABO/RH(D): O POS
Antibody Screen: NEGATIVE

## 2021-03-15 LAB — MAGNESIUM: Magnesium: 2.2 mg/dL (ref 1.7–2.4)

## 2021-03-15 LAB — TROPONIN I (HIGH SENSITIVITY)
Troponin I (High Sensitivity): 16 ng/L (ref <18)
Troponin I (High Sensitivity): 19 ng/L — ABNORMAL HIGH (ref <18)

## 2021-03-15 LAB — GLUCOSE, CAPILLARY: Glucose-Capillary: 89 mg/dL (ref 70–99)

## 2021-03-15 LAB — LACTIC ACID, PLASMA: Lactic Acid, Venous: 1.1 mmol/L (ref 0.5–1.9)

## 2021-03-15 LAB — PROCALCITONIN: Procalcitonin: <0.1 ng/mL

## 2021-03-15 LAB — PHOSPHORUS: Phosphorus: 3.2 mg/dL (ref 2.5–4.6)

## 2021-03-15 MED ORDER — IOHEXOL 350 MG/ML SOLN
75.0000 mL | Freq: Once | INTRAVENOUS | Status: AC | PRN
  Administered 2021-03-15: 75 mL via INTRAVENOUS

## 2021-03-15 MED ORDER — IPRATROPIUM-ALBUTEROL 0.5-2.5 (3) MG/3ML IN SOLN
3.0000 mL | Freq: Once | RESPIRATORY_TRACT | Status: AC
  Administered 2021-03-15: 3 mL via RESPIRATORY_TRACT
  Filled 2021-03-15: qty 3

## 2021-03-15 MED ORDER — AMIODARONE HCL 200 MG PO TABS
200.0000 mg | ORAL_TABLET | Freq: Every day | ORAL | Status: DC
  Administered 2021-03-15 – 2021-03-18 (×4): 200 mg via ORAL
  Filled 2021-03-15 (×5): qty 1

## 2021-03-15 MED ORDER — PANTOPRAZOLE SODIUM 40 MG IV SOLR
40.0000 mg | Freq: Every day | INTRAVENOUS | Status: DC
  Administered 2021-03-15: 40 mg via INTRAVENOUS
  Filled 2021-03-15: qty 40

## 2021-03-15 MED ORDER — AMIODARONE HCL 200 MG PO TABS: 200.0000 mg | ORAL_TABLET | Freq: Every day | ORAL | Status: DC

## 2021-03-15 MED ORDER — SODIUM CHLORIDE 0.9 % IV SOLN
500.0000 mg | INTRAVENOUS | Status: DC
  Administered 2021-03-15 – 2021-03-16 (×2): 500 mg via INTRAVENOUS
  Filled 2021-03-15 (×3): qty 500

## 2021-03-15 MED ORDER — DOCUSATE SODIUM 100 MG PO CAPS: 100.0000 mg | ORAL_CAPSULE | Freq: Two times a day (BID) | ORAL | Status: DC | PRN

## 2021-03-15 MED ORDER — IPRATROPIUM-ALBUTEROL 0.5-2.5 (3) MG/3ML IN SOLN: 3.0000 mL | Freq: Four times a day (QID) | RESPIRATORY_TRACT | Status: DC | PRN

## 2021-03-15 MED ORDER — SODIUM CHLORIDE 0.9 % IV SOLN
2.0000 g | Freq: Once | INTRAVENOUS | Status: AC
  Administered 2021-03-15: 2 g via INTRAVENOUS
  Filled 2021-03-15: qty 2

## 2021-03-15 MED ORDER — POLYETHYLENE GLYCOL 3350 17 G PO PACK: 17.0000 g | PACK | Freq: Every day | ORAL | Status: DC | PRN

## 2021-03-15 MED ORDER — MAGNESIUM OXIDE -MG SUPPLEMENT 400 (240 MG) MG PO TABS
200.0000 mg | ORAL_TABLET | Freq: Every day | ORAL | Status: DC
  Administered 2021-03-15 – 2021-03-18 (×4): 200 mg via ORAL
  Filled 2021-03-15 (×4): qty 1

## 2021-03-15 MED ORDER — METHYLPREDNISOLONE SODIUM SUCC 40 MG IJ SOLR
40.0000 mg | Freq: Two times a day (BID) | INTRAMUSCULAR | Status: DC
  Administered 2021-03-15: 40 mg via INTRAVENOUS
  Filled 2021-03-15: qty 1

## 2021-03-15 MED ORDER — CARVEDILOL 3.125 MG PO TABS: 3.1250 mg | ORAL_TABLET | Freq: Two times a day (BID) | ORAL | Status: DC

## 2021-03-15 MED ORDER — CARVEDILOL 3.125 MG PO TABS
3.1250 mg | ORAL_TABLET | Freq: Two times a day (BID) | ORAL | Status: DC
  Administered 2021-03-15 – 2021-03-16 (×2): 3.125 mg via ORAL
  Filled 2021-03-15 (×3): qty 1

## 2021-03-15 MED ORDER — SODIUM CHLORIDE 0.9 % IV SOLN
500.0000 mg | Freq: Once | INTRAVENOUS | Status: AC
  Administered 2021-03-15: 500 mg via INTRAVENOUS
  Filled 2021-03-15: qty 500

## 2021-03-15 MED ORDER — ACETAMINOPHEN 500 MG PO TABS
1000.0000 mg | ORAL_TABLET | Freq: Two times a day (BID) | ORAL | Status: DC
  Administered 2021-03-15 – 2021-03-19 (×8): 1000 mg via ORAL
  Filled 2021-03-15 (×8): qty 2

## 2021-03-15 MED ORDER — SODIUM CHLORIDE 0.9 % IV SOLN
2.0000 g | Freq: Three times a day (TID) | INTRAVENOUS | Status: DC
  Administered 2021-03-16: 2 g via INTRAVENOUS
  Filled 2021-03-15 (×3): qty 2

## 2021-03-15 MED ORDER — HYDROCOD POLST-CPM POLST ER 10-8 MG/5ML PO SUER
5.0000 mL | Freq: Two times a day (BID) | ORAL | Status: DC | PRN
  Administered 2021-03-15 – 2021-03-18 (×4): 5 mL via ORAL
  Filled 2021-03-15 (×4): qty 5

## 2021-03-15 MED ORDER — SODIUM CHLORIDE 0.9 % IV SOLN
1.0000 g | Freq: Once | INTRAVENOUS | Status: AC
  Administered 2021-03-15: 1 g via INTRAVENOUS
  Filled 2021-03-15: qty 10

## 2021-03-15 MED ORDER — LORATADINE 10 MG PO TABS
10.0000 mg | ORAL_TABLET | Freq: Every day | ORAL | Status: DC
  Administered 2021-03-16 – 2021-03-19 (×4): 10 mg via ORAL
  Filled 2021-03-15 (×4): qty 1

## 2021-03-15 NOTE — Progress Notes (Addendum)
PHARMACY CONSULT NOTE - FOLLOW UP  Pharmacy Consult for Electrolyte Monitoring and Replacement   Recent Labs: Potassium (mmol/L)  Date Value  03/15/2021 4.5  08/18/2013 4.4   Magnesium (mg/dL)  Date Value  11/12/2017 2.3   Calcium (mg/dL)  Date Value  03/15/2021 8.6 (L)   Calcium, Total (mg/dL)  Date Value  08/18/2013 9.4   Albumin (g/dL)  Date Value  08/18/2013 4.2   Sodium (mmol/L)  Date Value  03/15/2021 136  08/18/2013 138     Assessment: 70 y.o. male with medical history for afib, CHF, HTN, and anemia who was admitted on 03/15/2021 complaining of SOB, cough, and bloody phlegm. Pharmacy has been consulted for electrolyte management.   Goal of Therapy:  K 4-5.1 mmol/L and Mg 2-2.4 mg/dL All other electrolytes WNL   Plan:  Electrolytes are WNL, no replacement needed at this time.  Recheck electrolytes with AM labs  Sherilyn Banker ,PharmD Clinical Pharmacist 03/15/2021 7:56 PM

## 2021-03-15 NOTE — ED Triage Notes (Signed)
Chest pain and resp distress; pt is 90% on non rebreather; 156/92; hr 50s has pacemaker; pt is coughing up bright red blood; pt has afib and HTN; pt stated this started 45 minutes ago;

## 2021-03-15 NOTE — ED Notes (Signed)
1st attempt to give phone report to [place]; not available at this time; awaiting call back

## 2021-03-15 NOTE — ED Provider Notes (Signed)
[facility] Emergency Department Provider Note  ____________________________________________   I have reviewed the triage vital signs and the nursing notes.   HISTORY  Chief Complaint Shortness of Breath and Respiratory Distress   History limited by: Not limited   HPI Henry Moreno is a 70 y.o. male who presents to the emergency department today because of concern for shortness of breath, cough and bloody phlegm. The patient states that he has had increased cough over the past 3 weeks. He will occasional get coughs which he states he believes are related to sinus drainage. However over the past three weeks the cough has gotten worse. It has been related with some shortness of breath. Today the patient states that he coughed up a blood clot. He then checked his oxygen levels at home and they were in the 80s. He denies any baseline history of lung disease. He is on eliquis. Denies any chest pain. Denies any fevers.    Records reviewed. Per medical record review patient has a history of asthma  Past Medical History:  Diagnosis Date   Anemia    Asthma    Atrial fibrillation ([place])    Cardiac arrest ([place])    CHF (congestive heart failure) (HCC)    Chronic pain    neck    DDD (degenerative disc disease), cervical    Hypertension    ICD (implantable cardioverter-defibrillator) in place    NICM (nonischemic cardiomyopathy) ([place])    Sleep apnea     Patient Active Problem List   Diagnosis Date Noted   VF (ventricular fibrillation) ([place]) 11/12/2017   Chronic combined systolic and diastolic heart failure ([place]) 10/08/2015   Blood glucose elevated 09/28/2014   Long term current use of anticoagulant 08/25/2014   Atrial fibrillation ([place]) 06/06/2012   Automatic implantable cardioverter-defibrillator in situ 01/19/2012   Dyslipidemia 05/26/2011   Essential (primary) hypertension 05/26/2011   Adiposity 05/26/2011   Apnea, sleep 05/26/2011   Ventricular  fibrillation ([place]) 05/26/2011    Past Surgical History:  Procedure Laterality Date   Automatic Implantable Cardiac Defibrillator in situ  2013   Medtronic [id number] biventricular defibrillator with Medtronic [id number] atrial lead.   CARDIAC CATHETERIZATION     COLONOSCOPY N/A 08/30/2020   Procedure: COLONOSCOPY;  Surgeon: Toledo, Benay Pike, MD;  Location: [facility] ENDOSCOPY;  Service: Gastroenterology;  Laterality: N/A;   ESOPHAGOGASTRODUODENOSCOPY (EGD) WITH PROPOFOL N/A 08/30/2020   Procedure: ESOPHAGOGASTRODUODENOSCOPY (EGD) WITH PROPOFOL;  Surgeon: Toledo, Benay Pike, MD;  Location: [facility] ENDOSCOPY;  Service: Gastroenterology;  Laterality: N/A;   HERNIA REPAIR     INSERT / REPLACE / REMOVE PACEMAKER     LEFT HEART CATH AND CORONARY ANGIOGRAPHY N/A 11/14/2017   Procedure: LEFT HEART CATH AND CORONARY ANGIOGRAPHY;  Surgeon: [name], MD;  Location: [place] [facility];  Service: Cardiovascular;  Laterality: N/A;   LIPOMA EXCISION     TONSILLECTOMY     ULTRASOUND GUIDANCE FOR VASCULAR ACCESS  11/14/2017   Procedure: Ultrasound Guidance For Vascular Access;  Surgeon: [name], MD;  Location: [facility] [facility];  Service: Cardiovascular;;    Prior to Admission medications   Medication Sig Start Date End Date Taking? Authorizing Provider  acetaminophen (TYLENOL) 500 MG tablet Take 1,000 mg by mouth 2 (two) times daily.    [provider]  amiodarone (PACERONE) 200 MG tablet Take 200 mg by mouth at bedtime. 08/06/17   [provider]  amiodarone (PACERONE) 200 MG tablet Take 2 tablets (400 mg total) by mouth  2 (two) times daily. START  by taking 400mg  (2 tabs) twice daily for 2 weeks then reduce to 200mg  (1 tab) twice daily for 2 weeks, then reduce back to your usual 200mg  daily 11/15/17   Baldwin Jamaica, PA-C  amiodarone (PACERONE) 200 MG tablet Take 1 tablet (200 mg total) by mouth daily. 12/26/17   Baldwin Jamaica, PA-C  apixaban (ELIQUIS) 5 MG TABS tablet Take 5  mg by mouth 2 (two) times daily.  10/14/15   [provider]  azelastine (ASTELIN) 0.1 % nasal spray Place 1 spray into both nostrils as needed. 10/01/17   [provider]  benzonatate (TESSALON) 200 MG capsule Take 200 mg by mouth 3 (three) times daily as needed for cough. 11/03/17   [provider]  carvedilol (COREG) 25 MG tablet TAKE 1 TABLET(25 MG) BY MOUTH TWICE DAILY WITH A MEAL 12/26/17   Camnitz, Ocie Doyne, MD  cetirizine (ZYRTEC) 10 MG tablet Take 10 mg by mouth at bedtime.  02/15/10   [provider]  Cholecalciferol (VITAMIN D3) 3000 units TABS Take 1 capsule by mouth daily. Taking a  2000mg  dose 02/15/10   [provider]  furosemide (LASIX) 20 MG tablet Take 1 tablet (20 mg total) by mouth daily as needed for fluid or edema (for weight gain of 3pounds in 24 hours or 5 pound in a week as discussed). 11/15/17 11/15/18  Baldwin Jamaica, PA-C  Magnesium 200 MG TABS Take 200 mg by mouth at bedtime.     [provider]  sacubitril-valsartan (ENTRESTO) 49-51 MG Take 1 tablet by mouth 2 (two) times daily. 12/27/17   Camnitz, Ocie Doyne, MD    Allergies Ace inhibitors and Ibuprofen  Family History  Adopted: Yes    Social History Social History   Tobacco Use   Smoking status: Never   Smokeless tobacco: Never  Vaping Use   Vaping Use: Never used  Substance Use Topics   Alcohol use: No   Drug use: No    Review of Systems Constitutional: No fever/chills Eyes: No visual changes. ENT: No sore throat. Cardiovascular: Denies chest pain. Respiratory: Positive shortness of breath. Positive for coughing up blood.  Gastrointestinal: No abdominal pain.  No nausea, no vomiting.  No diarrhea.   Genitourinary: Negative for dysuria. Musculoskeletal: Negative for back pain. Skin: Negative for rash. Neurological: Negative for headaches, focal weakness or numbness.  ____________________________________________   PHYSICAL EXAM:  VITAL  SIGNS: ED Triage Vitals  Enc Vitals Group     BP --      Pulse --      Resp --      Temp --      Temp src --      SpO2 03/15/21 1508 (!) 85 %     Weight 03/15/21 1510 200 lb (90.7 kg)     Height 03/15/21 1510 5\' 8"  (1.727 m)     Head Circumference --      Peak Flow --      Pain Score 03/15/21 1509 0    Constitutional: Alert and oriented.  Eyes: Conjunctivae are normal.  ENT      Head: Normocephalic and atraumatic.      Nose: No congestion/rhinnorhea.      Mouth/Throat: Mucous membranes are moist.      Neck: No stridor. Hematological/Lymphatic/Immunilogical: No cervical lymphadenopathy. Cardiovascular: Normal rate, regular rhythm.  No murmurs, rubs, or gallops.  Respiratory: Slightly increased respiratory effort. Wheezing at bilateral bases.  Gastrointestinal: Soft and non  tender. No rebound. No guarding.  Genitourinary: Deferred Musculoskeletal: Normal range of motion in all extremities. No lower extremity edema. Neurologic:  Normal speech and language. No gross focal neurologic deficits are appreciated.  Skin:  Skin is warm, dry and intact. No rash noted. Psychiatric: Mood and affect are normal. Speech and behavior are normal. Patient exhibits appropriate insight and judgment.  ____________________________________________    LABS (pertinent positives/negatives)  Trop hs 16 COVID negative, Influenza negative BMP wnl except glu 136, BUN 27, ca 8.6 INR 1.3 CBC wbc 8.1, hgb 12.6, plt 271  ____________________________________________   EKG  I, Nance Pear, attending physician, personally viewed and interpreted this EKG  EKG Time: 1514 Rate: 71 Rhythm: paced rhythm Axis: left axis deviation Intervals: qtc 511 QRS: wide ST changes: no st elevation Impression: abnormal ekg   ____________________________________________    RADIOLOGY  CXR Changes consistent with CHF and patchy parenchymal edema.  CT angio PE Diffuse alveolar opacity, concerning for  pulmonary hemorrhage.   ____________________________________________   PROCEDURES  Procedures  CRITICAL CARE Performed by: Nance Pear   Total critical care time: 30 minutes  Critical care time was exclusive of separately billable procedures and treating other patients.  Critical care was necessary to treat or prevent imminent or life-threatening deterioration.  Critical care was time spent personally by me on the following activities: development of treatment plan with patient and/or surrogate as well as nursing, discussions with consultants, evaluation of patient's response to treatment, examination of patient, obtaining history from patient or surrogate, ordering and performing treatments and interventions, ordering and review of laboratory studies, ordering and review of radiographic studies, pulse oximetry and re-evaluation of patient's condition.  ____________________________________________   INITIAL IMPRESSION / ASSESSMENT AND PLAN / ED COURSE  Pertinent labs & imaging results that were available during my care of the patient were reviewed by me and considered in my medical decision making (see chart for details).  Patient presented to the emergency department today because of concern for worsening cough and hemoptysis. The patient was initially put on non rebreather but was able to slowly be weaned down to nasal cannula. Work up included CT angio which was concerning for pulmonary hemorrhage. Atypical infection could not be ruled out. Because of this broad spectrum antibiotics were started. Discussed with ICU for admission.  ____________________________________________   FINAL CLINICAL IMPRESSION(S) / ED DIAGNOSES  Final diagnoses:  Hemoptysis  Hypoxia  Shortness of breath     Note: This dictation was prepared with Dragon dictation. Any transcriptional errors that result from this process are unintentional     Nance Pear, MD 03/15/21 2027

## 2021-03-15 NOTE — Progress Notes (Signed)
Pharmacy Antibiotic Note  Henry Moreno is a 70 y.o. male admitted on 03/15/2021 complaining of SOB, cough, and bloody phlegm. PT received one time dose of ceftriaxone in the ED. Pharmacy has been consulted for cefepime dosing for CAP.  Plan: Cefepime 2 g IV q8h Monitor renal function and adjust dose as clinically indicated   Height: 5\' 8"  (172.7 cm) Weight: 90.7 kg (200 lb) IBW/kg (Calculated) : 68.4  No data recorded.  Recent Labs  Lab 03/15/21 1514  WBC 8.1  CREATININE 0.92    Estimated Creatinine Clearance: 84 mL/min (by C-G formula based on SCr of 0.92 mg/dL).    Allergies  Allergen Reactions   Ace Inhibitors Cough   Ibuprofen Other (See Comments)    Other Reaction: HIVES / RASH    Antimicrobials this admission: 7/19 ceftriaxone x1 7/19 azithromycin >> 7/19 cefepime >>   Dose adjustments this admission: N/A  Microbiology results: 7/19 BCx: sent  Thank you for allowing pharmacy to be a part of this patient's care.  Sherilyn Banker, PharmD Clinical Pharmacist 03/15/2021 7:52 PM

## 2021-03-15 NOTE — H&P (Addendum)
NAME:  Henry Moreno, MRN:  [id number], DOB:  [birth year], LOS: 0 ADMISSION DATE:  03/15/2021, CONSULTATION DATE:  03/15/2021 REFERRING MD:  Nance Pear, CHIEF COMPLAINT:  Hemoptysis, SOB, hypoxia   History of present illness   70 Y.O male with significant PMH of atrial fibrillation on anticoagulation with Eliquis, bronchitis, asthma, OSA on CPAP, nonischemic cardiomyopathy, V. fib arrest, chronic combined systolic and diastolic heart failure, ICD in place, HLD, hypertension, cervical DDD who presented to the ED with chief complaints of hemoptysis, shortness of breath and chest pain.  Patient states symptoms started today, he had three episode of coughing up bright red blood associated with chest pressure. He denies other associated symptoms of fever, chills, nausea or vomiting, or palpitation. Due to concerns of worsening symptoms, patient decided to call EMS.   ED: On arrival to the ED, he was afebrile with blood pressure 112/64 mm Hg and pulse rate 65 beats/min, sats initially 67 on RA and was placed on non-rebreather with improvement to >90%. There were no focal neurological deficits; he was alert and oriented x4, and he did not demonstrate any memory deficits.   Labs/Diagnostics WBC/Hgb/Hct/Plts:  8.1/12.6/38.8/271 (07/19 1514)  Glucose/BUN/Cr/calcium: 136/27/0.92/8.6 PT/INR: 16.6/1.3 SARS Coronavirus 2 by RT PCR negative BNP: 382.9 EKG: EKG: unchanged from previous tracings, left axis deviation, Qtc 51, Rate 71 paced rhythm, No st changes. UA: Pending Lactate: Pending  Past Medical History  Atrial fibrillation on anticoagulation with Eliquis, bronchitis, asthma, OSA on CPAP, nonischemic cardiomyopathy, V. fib arrest, chronic combined systolic and diastolic heart failure, ICD in place, HLD, hypertension, cervical DDD   Significant Hospital Events   7/19: Admitted to stepdown unit for acute hypoxic respiratory failure with hemoptysis  Consults:  PCCM  Procedures:   None  Significant Diagnostic Tests:  7/19: Chest Xray>Changes consistent with CHF and patchy parenchymal edema bilaterally. 7/19: CTA Chest>No PE. Diffuse alveolar pulmonary opacity throughout both upper lobes and the superior segments of both lower lobes with trace right pleural fluid. Findings are suggestive of pulmonary hemorrhage. Component of atypical pneumonia or pulmonary edema may also be present.  Micro Data:  7/19: SARS-CoV-2 PCR>> negative 7/19: Influenza PCR>> negative 7/19: Blood culture x2>> 7/19: Urine Culture>> 7/19: MRSA PCR>>  7/19: Strep pneumo urinary antigen> 7/19: Legionella urinary antigen> 7/19: Mycoplasma pneumonia>  Antimicrobials:  Cefepime 7/19> Azithromycin 7/19>  OBJECTIVE  Blood pressure 106/66, pulse 60, resp. rate (!) 26, height _0  (1.727 m), weight 90.7 kg, SpO2 94 %.        Intake/Output Summary (Last 24 hours) at 03/15/2021 1948 Last data filed at 03/15/2021 1933 Gross per 24 hour  Intake 249.72 ml  Output --  Net 249.72 ml   Filed Weights   03/15/21 1510  Weight: 90.7 kg     Physical Examination  GENERAL: 70 year-old critically ill patient lying in the bed with no acute distress.  EYES: Pupils equal, round, reactive to light and accommodation. No scleral icterus. Extraocular muscles intact.  HEENT: Head atraumatic, normocephalic. Oropharynx and nasopharynx clear.  NECK:  Supple, no jugular venous distention. No thyroid enlargement, no tenderness.  LUNGS: Decreased breath sounds bilaterally, no wheezing, rales,rhonchi or crepitation. No use of accessory muscles of respiration.  CARDIOVASCULAR: S1, S2 normal. No murmurs, rubs, or gallops.  ABDOMEN: Soft, nontender, nondistended. Bowel sounds present. No organomegaly or mass.  EXTREMITIES: MILD pedal edema, cyanosis, or clubbing.  NEUROLOGIC: Cranial nerves II through XII are intact.  Muscle strength 5/5 in all extremities. Sensation intact. Gait not checked.  PSYCHIATRIC: The  patient is alert and oriented x 3.  SKIN: No obvious rash, lesion, or ulcer.   Labs/imaging that I havepersonally reviewed  (right click and "Reselect all SmartList Selections" daily)      Labs   CBC: Recent Labs  Lab 03/15/21 1514  WBC 8.1  NEUTROABS 5.7  HGB 12.6*  HCT 38.8*  MCV 95.8  PLT 161    Basic Metabolic Panel: Recent Labs  Lab 03/15/21 1514  NA 136  K 4.5  CL 106  CO2 22  GLUCOSE 136*  BUN 27*  CREATININE 0.92  CALCIUM 8.6*   GFR: Estimated Creatinine Clearance: 84 mL/min (by C-G formula based on SCr of 0.92 mg/dL). Recent Labs  Lab 03/15/21 1514  WBC 8.1    Liver Function Tests: No results for input(s): AST, ALT, ALKPHOS, BILITOT, PROT, ALBUMIN in the last 168 hours. No results for input(s): LIPASE, AMYLASE in the last 168 hours. No results for input(s): AMMONIA in the last 168 hours.  ABG No results found for: PHART, PCO2ART, PO2ART, HCO3, TCO2, ACIDBASEDEF, O2SAT   Coagulation Profile: Recent Labs  Lab 03/15/21 1514  INR 1.3*    Cardiac Enzymes: No results for input(s): CKTOTAL, CKMB, CKMBINDEX, TROPONINI in the last 168 hours.  HbA1C: No results found for: HGBA1C  CBG: No results for input(s): GLUCAP in the last 168 hours.  Review of Systems:   Review of Systems  Constitutional:  Negative for chills and fever.  HENT: Negative.    Eyes: Negative.   Respiratory:  Positive for cough, hemoptysis and shortness of breath. Negative for sputum production and wheezing.   Cardiovascular:  Positive for chest pain and leg swelling.  Gastrointestinal:  Negative for abdominal pain, blood in stool, constipation, diarrhea, heartburn, melena, nausea and vomiting.  Genitourinary:  Negative for dysuria, flank pain, frequency, hematuria and urgency.  Musculoskeletal:  Positive for back pain, joint pain and neck pain.  Skin: Negative.   Neurological:  Negative for dizziness, tingling, tremors, sensory change, speech change, focal weakness,  seizures, loss of consciousness, weakness and headaches.  Endo/Heme/Allergies:  Bruises/bleeds easily.  Psychiatric/Behavioral:  Negative for depression, hallucinations, memory loss, substance abuse and suicidal ideas. The patient is nervous/anxious. The patient does not have insomnia.    Past Medical History  He,  has a past medical history of Anemia, Asthma, Atrial fibrillation ([place]), Cardiac arrest ([place]), CHF (congestive heart failure) ([place]), Chronic pain, DDD (degenerative disc disease), cervical, Hypertension, ICD (implantable cardioverter-defibrillator) in place, NICM (nonischemic cardiomyopathy) ([place]), and Sleep apnea.   Surgical History    Past Surgical History:  Procedure Laterality Date   Automatic Implantable Cardiac Defibrillator in situ  2013   Medtronic [id number] biventricular defibrillator with Medtronic 5076 atrial lead.   CARDIAC CATHETERIZATION     COLONOSCOPY N/A 08/30/2020   Procedure: COLONOSCOPY;  Surgeon: Toledo, Benay Pike, MD;  Location: [facility];  Service: Gastroenterology;  Laterality: N/A;   ESOPHAGOGASTRODUODENOSCOPY (EGD) WITH PROPOFOL N/A 08/30/2020   Procedure: ESOPHAGOGASTRODUODENOSCOPY (EGD) WITH PROPOFOL;  Surgeon: Toledo, Benay Pike, MD;  Location: [facility] ENDOSCOPY;  Service: Gastroenterology;  Laterality: N/A;   HERNIA REPAIR     INSERT / REPLACE / REMOVE PACEMAKER     LEFT HEART CATH AND CORONARY ANGIOGRAPHY N/A 11/14/2017   Procedure: LEFT HEART CATH AND CORONARY ANGIOGRAPHY;  Surgeon: [name], MD;  Location: [facility];  Service: Cardiovascular;  Laterality: N/A;   LIPOMA EXCISION     TONSILLECTOMY     ULTRASOUND GUIDANCE FOR VASCULAR ACCESS  11/14/2017   Procedure: Ultrasound Guidance For Vascular Access;  Surgeon: [name], MD;  Location: [facility];  Service: Cardiovascular;;     Social History   reports that he has never smoked. He has never used smokeless tobacco. He reports that he does not drink alcohol and  does not use drugs.   Family History   His family history is not on file. He was adopted.   Allergies Allergies  Allergen Reactions   Ace Inhibitors Cough   Ibuprofen Other (See Comments)    Other Reaction: HIVES / RASH     Home Medications  Prior to Admission medications   Medication Sig Start Date End Date Taking? Authorizing Provider  acetaminophen (TYLENOL) 500 MG tablet Take 1,000 mg by mouth 2 (two) times daily.    [provider]  amiodarone (PACERONE) 200 MG tablet Take 200 mg by mouth at bedtime. 08/06/17   [provider]  amiodarone (PACERONE) 200 MG tablet Take 2 tablets (400 mg total) by mouth 2 (two) times daily. START  by taking 462m (2 tabs) twice daily for 2 weeks then reduce to 2064m(1 tab) twice daily for 2 weeks, then reduce back to your usual 20069maily 11/15/17   UrsBaldwin JamaicaA-C  amiodarone (PACERONE) 200 MG tablet Take 1 tablet (200 mg total) by mouth daily. 12/26/17   UrsBaldwin JamaicaA-C  apixaban (ELIQUIS) 5 MG TABS tablet Take 5 mg by mouth 2 (two) times daily.  10/14/15   [provider]  azelastine (ASTELIN) 0.1 % nasal spray Place 1 spray into both nostrils as needed. 10/01/17   [provider]  benzonatate (TESSALON) 200 MG capsule Take 200 mg by mouth 3 (three) times daily as needed for cough. 11/03/17   [provider]  carvedilol (COREG) 25 MG tablet TAKE 1 TABLET(25 MG) BY MOUTH TWICE DAILY WITH A MEAL 12/26/17   Camnitz, WilOcie DoyneD  cetirizine (ZYRTEC) 10 MG tablet Take 10 mg by mouth at bedtime.  02/15/10   [provider]  Cholecalciferol (VITAMIN D3) 3000 units TABS Take 1 capsule by mouth daily. Taking a  2000m28mse 02/15/10   [provider]  furosemide (LASIX) 20 MG tablet Take 1 tablet (20 mg total) by mouth daily as needed for fluid or edema (for weight gain of 3pounds in 24 hours or 5 pound in a week as discussed). 11/15/17 11/15/18  UrsuBaldwin [place]-C  Magnesium 200 MG  TABS Take 200 mg by mouth at bedtime.     [provider]  sacubitril-valsartan (ENTRESTO) 49-51 MG Take 1 tablet by mouth 2 (two) times daily. 12/27/17   Camnitz, WillOcie Doyne  Scheduled Meds:  pantoprazole (PROTONIX) IV  40 mg Intravenous QHS   Continuous Infusions:  azithromycin     [START ON 03/16/2021] ceFEPime (MAXIPIME) IV     PRN Meds:.docusate sodium, ipratropium-albuterol, polyethylene glycol  Assessment & Plan:  Acute Hypoxic Respiratory Failure secondary to Pneumonia with suspected pulmonary hemorrhage and Pulmonary edema Hx: Bronchitis, Asthma, Obstructive Sleep Apnea  -Supplemental O2 as needed to maintain O2 saturations 88 to 92% -High risk for intubation -Follow intermittent ABG and chest x-ray as needed -Follow cultures, trend lactic/ PCT -monitor WBC/ H&H, fever curve -IV antibiotics: Continue coverage with cefepime &Azithromycin -IV Corticosteroids -Will consider nebulized TXA if worsen or hemoptysis becomes massive and urgent bronch -Hold Eliquis for now -Keep Npo -Plan for Bronchoscopy to obtain BAL and localize source of hemorrhage  Acute on Chronic Systolic CHF (last known EF 11/2020 with severe lv dysfunction EF 20-25%) BNP mildly elevated 380 -Hypertension Hx: CAD status post Cath in March 2019, HLD  -Continuous cardiac monitoring -Maintain MAP greater than 65 -Continue carvedilol AND Entresto as BP permits -Repeat 2D Echocardiogram   Paroxysmal atrial fibrillation Hx: VF arrest s/p ICD 2011 and 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 Followed in EP.  on Eliquis for pAF and CHADS-Vasc of 2. -Hold Eliquis due to hemoptysis as above -continue amiodarone 200 mg daily   OSA on CPAP -CPAP at night, hold due to hemoptysis until able to tolerate  Best practice:  Diet:  NPO Pain/Anxiety/Delirium protocol (if indicated): No VAP protocol (if indicated): Not indicated DVT prophylaxis: Contraindicated GI prophylaxis: PPI Glucose control:  SSI No Central  venous access:  N/A Arterial line:  N/A Foley:  N/A Mobility:  bed rest  PT consulted: N/A Last date of multidisciplinary goals of care discussion [7/19] Code Status:  full code Disposition: Stepdown   = Goals of Care = Code Status Order: FULL  Primary Emergency Contact: CYLUS, [place], Home Phone: [phone] Patient wishes to pursue full aggressive treatment and intervention options, including CPR and intubation  Critical care time: 45 minutes     Rufina Falco, DNP, CCRN, FNP-C, AGACNP-BC Acute Care Nurse Practitioner  [facility] Pager: [phone] [place] [facility]  .

## 2021-03-16 DIAGNOSIS — R042 Hemoptysis: Secondary | ICD-10-CM | POA: Diagnosis not present

## 2021-03-16 DIAGNOSIS — J9601 Acute respiratory failure with hypoxia: Secondary | ICD-10-CM | POA: Diagnosis not present

## 2021-03-16 DIAGNOSIS — R9389 Abnormal findings on diagnostic imaging of other specified body structures: Secondary | ICD-10-CM | POA: Diagnosis not present

## 2021-03-16 LAB — BODY FLUID CELL COUNT WITH DIFFERENTIAL
Total Nucleated Cell Count, Fluid: 0 cu mm (ref 0–1000)
Total Nucleated Cell Count, Fluid: 1 cu mm (ref 0–1000)
Total Nucleated Cell Count, Fluid: 11 cu mm (ref 0–1000)

## 2021-03-16 LAB — BASIC METABOLIC PANEL
Anion gap: 8 (ref 5–15)
BUN: 20 mg/dL (ref 8–23)
CO2: 23 mmol/L (ref 22–32)
Calcium: 8.6 mg/dL — ABNORMAL LOW (ref 8.9–10.3)
Chloride: 105 mmol/L (ref 98–111)
Creatinine, Ser: 0.83 mg/dL (ref 0.61–1.24)
GFR, Estimated: >60 mL/min (ref >60)
Glucose, Bld: 139 mg/dL — ABNORMAL HIGH (ref 70–99)
Potassium: 4.6 mmol/L (ref 3.5–5.1)
Sodium: 136 mmol/L (ref 135–145)

## 2021-03-16 LAB — CBC
HCT: 36.3 % — ABNORMAL LOW (ref 39.0–52.0)
Hemoglobin: 11.6 g/dL — ABNORMAL LOW (ref 13.0–17.0)
MCH: 30.5 pg (ref 26.0–34.0)
MCHC: 32 g/dL (ref 30.0–36.0)
MCV: 95.5 fL (ref 80.0–100.0)
Platelets: 222 10*3/uL (ref 150–400)
RBC: 3.8 MIL/uL — ABNORMAL LOW (ref 4.22–5.81)
RDW: 15 % (ref 11.5–15.5)
WBC: 9.3 10*3/uL (ref 4.0–10.5)
nRBC: 0 % (ref 0.0–0.2)

## 2021-03-16 LAB — C-REACTIVE PROTEIN: CRP: 1.3 mg/dL — ABNORMAL HIGH (ref <1.0)

## 2021-03-16 LAB — SEDIMENTATION RATE: Sed Rate: 44 mm/hr — ABNORMAL HIGH (ref 0–20)

## 2021-03-16 LAB — HIV ANTIBODY (ROUTINE TESTING W REFLEX): HIV Screen 4th Generation wRfx: NONREACTIVE

## 2021-03-16 LAB — PHOSPHORUS: Phosphorus: 2.7 mg/dL (ref 2.5–4.6)

## 2021-03-16 LAB — MRSA NEXT GEN BY PCR, NASAL: MRSA by PCR Next Gen: NOT DETECTED

## 2021-03-16 LAB — MAGNESIUM: Magnesium: 2.1 mg/dL (ref 1.7–2.4)

## 2021-03-16 LAB — LACTIC ACID, PLASMA: Lactic Acid, Venous: 1 mmol/L (ref 0.5–1.9)

## 2021-03-16 MED ORDER — LIDOCAINE HCL (PF) 2 % IJ SOLN
5.0000 mL | Freq: Once | INTRAMUSCULAR | Status: AC
  Administered 2021-03-16: 5 mL
  Filled 2021-03-16: qty 5

## 2021-03-16 MED ORDER — MIDAZOLAM HCL 2 MG/2ML IJ SOLN
0.5000 mg | INTRAMUSCULAR | Status: AC | PRN
  Administered 2021-03-16: 2 mg via INTRAVENOUS
  Filled 2021-03-16 (×2): qty 2

## 2021-03-16 MED ORDER — IPRATROPIUM-ALBUTEROL 0.5-2.5 (3) MG/3ML IN SOLN
3.0000 mL | Freq: Three times a day (TID) | RESPIRATORY_TRACT | Status: DC
  Administered 2021-03-16: 3 mL via RESPIRATORY_TRACT
  Filled 2021-03-16: qty 3

## 2021-03-16 MED ORDER — IPRATROPIUM-ALBUTEROL 0.5-2.5 (3) MG/3ML IN SOLN
3.0000 mL | Freq: Three times a day (TID) | RESPIRATORY_TRACT | Status: DC
  Administered 2021-03-16 – 2021-03-17 (×2): 3 mL via RESPIRATORY_TRACT
  Filled 2021-03-16 (×3): qty 3

## 2021-03-16 MED ORDER — LIDOCAINE HCL (PF) 2 % IJ SOLN
10.0000 mL | Freq: Once | INTRAMUSCULAR | Status: AC
  Administered 2021-03-16: 10 mL
  Filled 2021-03-16: qty 10

## 2021-03-16 MED ORDER — SODIUM CHLORIDE 0.9 % IV SOLN
2.0000 g | INTRAVENOUS | Status: DC
  Administered 2021-03-16: 2 g via INTRAVENOUS
  Filled 2021-03-16: qty 20
  Filled 2021-03-16: qty 2

## 2021-03-16 MED ORDER — FENTANYL CITRATE (PF) 100 MCG/2ML IJ SOLN
25.0000 ug | INTRAMUSCULAR | Status: AC | PRN
  Administered 2021-03-16: 100 ug via INTRAVENOUS
  Filled 2021-03-16 (×2): qty 2

## 2021-03-16 MED ORDER — CARVEDILOL 6.25 MG PO TABS
12.5000 mg | ORAL_TABLET | Freq: Two times a day (BID) | ORAL | Status: DC
  Administered 2021-03-17 – 2021-03-18 (×3): 12.5 mg via ORAL
  Filled 2021-03-16 (×2): qty 2
  Filled 2021-03-16: qty 1

## 2021-03-16 MED ORDER — FUROSEMIDE 10 MG/ML IJ SOLN
40.0000 mg | Freq: Once | INTRAMUSCULAR | Status: AC
  Administered 2021-03-16: 40 mg via INTRAVENOUS
  Filled 2021-03-16: qty 4

## 2021-03-16 MED ORDER — APIXABAN 5 MG PO TABS
5.0000 mg | ORAL_TABLET | Freq: Two times a day (BID) | ORAL | Status: DC
  Administered 2021-03-16 – 2021-03-17 (×2): 5 mg via ORAL
  Filled 2021-03-16 (×2): qty 1

## 2021-03-16 MED ORDER — DM-GUAIFENESIN ER 30-600 MG PO TB12
1.0000 | ORAL_TABLET | Freq: Two times a day (BID) | ORAL | Status: DC
  Administered 2021-03-16: 1 via ORAL
  Filled 2021-03-16 (×2): qty 1

## 2021-03-16 NOTE — Progress Notes (Signed)
PHARMACY CONSULT NOTE - FOLLOW UP  Pharmacy Consult for Electrolyte Monitoring and Replacement   Recent Labs: Potassium (mmol/L)  Date Value  03/16/2021 4.6  08/18/2013 4.4   Magnesium (mg/dL)  Date Value  03/16/2021 2.1   Calcium (mg/dL)  Date Value  03/16/2021 8.6 (L)   Calcium, Total (mg/dL)  Date Value  08/18/2013 9.4   Albumin (g/dL)  Date Value  08/18/2013 4.2   Phosphorus (mg/dL)  Date Value  03/16/2021 2.7   Sodium (mmol/L)  Date Value  03/16/2021 136  08/18/2013 138     Assessment: 70 y.o. male with medical history for afib, CHF, HTN, and anemia who was admitted on 03/15/2021 complaining of SOB, cough, and bloody phlegm. Pharmacy has been consulted for electrolyte management.   Goal of Therapy:  Electrolytes WNL   Plan:  Electrolytes are WNL, no replacement needed at this time.  Follow electrolytes as clinically indicated or ordered by team  Tawnya Crook ,PharmD, BCPS Clinical Pharmacist 03/16/2021 1:19 PM

## 2021-03-16 NOTE — Procedures (Signed)
Bronchoscopy Procedure Note  Henry Moreno  [id number]  [birth year]  Date:03/16/21  Time:9:18 AM   Provider Performing:Travor Royce Sharene Butters   Procedure(s):  Flexible bronchoscopy with bronchial alveolar lavage [phone])  Indication(s) Abnormal CT chest Diffuse bilateral alveolar groundglass opacities Concern for diffuse alveolar hemorrhage  Consent Risks of the procedure as well as the alternatives and risks of each were explained to the patient and/or caregiver.  Consent for the procedure was obtained and is signed in the bedside chart  Anesthesia Conscious sedation with continuous hemodynamic monitoring. 200 mcg fentanyl, 2 mg Versed given during procedure.   Time Out Verified patient identification, verified procedure, site/side was marked, verified correct patient position, special equipment/implants available, medications/allergies/relevant history reviewed, required imaging and test results available.   Sterile Technique Usual hand hygiene, masks, gowns, and gloves were used   Procedure Description Bronchoscope advanced through mouth and into airway.  Airways were examined down to subsegmental level with findings noted below.   Following diagnostic evaluation, serial BAL(s) performed in anterior segment of the right upper lobe with normal saline and return of 30 cc fluid. Tube 1 was cloudy, tubes 2-3 clear.  Findings: Normal vocal cords. Normal trachea. No blood noted in airway. Airway mucosa was normal appearing. The bronchoscopy was wedged into the anterior segment of the right upper lobe and serial alliquots were performed with collection of approximately 30 cc of BAL fluid split into 3 tubes. No progressively bloody BAL fluid noted. The scope was then withdrawn.  Complications/Tolerance None; patient tolerated the procedure well. Chest X-ray is not needed post procedure.   EBL Minimal   Specimen(s) BAL in 3 tubes. Sent for cell count and differential x3,  bacterial/AFB/fungal culture, RVP, cytology.  Bennie Pierini, MD 03/16/21 9:22 AM

## 2021-03-17 ENCOUNTER — Inpatient Hospital Stay (HOSPITAL_COMMUNITY)
Admit: 2021-03-17 | Discharge: 2021-03-17 | Disposition: A | Discharge status: Home / Self Care | Payer: 59 | Attending: Internal Medicine | Admitting: Internal Medicine

## 2021-03-17 ENCOUNTER — Inpatient Hospital Stay: Payer: 59

## 2021-03-17 DIAGNOSIS — I5023 Acute on chronic systolic (congestive) heart failure: Secondary | ICD-10-CM | POA: Diagnosis not present

## 2021-03-17 DIAGNOSIS — J9601 Acute respiratory failure with hypoxia: Secondary | ICD-10-CM

## 2021-03-17 DIAGNOSIS — J810 Acute pulmonary edema: Secondary | ICD-10-CM | POA: Diagnosis not present

## 2021-03-17 DIAGNOSIS — R042 Hemoptysis: Secondary | ICD-10-CM

## 2021-03-17 LAB — CBC
HCT: 35.1 % — ABNORMAL LOW (ref 39.0–52.0)
Hemoglobin: 11.5 g/dL — ABNORMAL LOW (ref 13.0–17.0)
MCH: 31.3 pg (ref 26.0–34.0)
MCHC: 32.8 g/dL (ref 30.0–36.0)
MCV: 95.4 fL (ref 80.0–100.0)
Platelets: 240 10*3/uL (ref 150–400)
RBC: 3.68 MIL/uL — ABNORMAL LOW (ref 4.22–5.81)
RDW: 15.1 % (ref 11.5–15.5)
WBC: 11.2 10*3/uL — ABNORMAL HIGH (ref 4.0–10.5)
nRBC: 0 % (ref 0.0–0.2)

## 2021-03-17 LAB — ECHOCARDIOGRAM COMPLETE BUBBLE STUDY
AR max vel: 3.02 cm2
AV Area VTI: 3.86 cm2
AV Area mean vel: 3.27 cm2
AV Mean grad: 3 mmHg
AV Peak grad: 5.6 mmHg
Ao pk vel: 1.18 m/s
Area-P 1/2: 3.53 cm2
Calc EF: 22.5 %
S' Lateral: 4.36 cm
Single Plane A2C EF: 28.6 %
Single Plane A4C EF: 16.7 %

## 2021-03-17 LAB — BASIC METABOLIC PANEL
Anion gap: 6 (ref 5–15)
BUN: 39 mg/dL — ABNORMAL HIGH (ref 8–23)
CO2: 25 mmol/L (ref 22–32)
Calcium: 8.9 mg/dL (ref 8.9–10.3)
Chloride: 106 mmol/L (ref 98–111)
Creatinine, Ser: 1.22 mg/dL (ref 0.61–1.24)
GFR, Estimated: >60 mL/min (ref >60)
Glucose, Bld: 100 mg/dL — ABNORMAL HIGH (ref 70–99)
Potassium: 4.2 mmol/L (ref 3.5–5.1)
Sodium: 137 mmol/L (ref 135–145)

## 2021-03-17 LAB — STREP PNEUMONIAE URINARY ANTIGEN: Strep Pneumo Urinary Antigen: NEGATIVE

## 2021-03-17 LAB — GLOMERULAR BASEMENT MEMBRANE ANTIBODIES: GBM Ab: <0.2 units (ref 0.0–0.9)

## 2021-03-17 LAB — ANA W/REFLEX IF POSITIVE: Anti Nuclear Antibody (ANA): NEGATIVE

## 2021-03-17 LAB — BRAIN NATRIURETIC PEPTIDE: B Natriuretic Peptide: 214.4 pg/mL — ABNORMAL HIGH (ref 0.0–100.0)

## 2021-03-17 MED ORDER — IPRATROPIUM-ALBUTEROL 0.5-2.5 (3) MG/3ML IN SOLN: 3.0000 mL | Freq: Four times a day (QID) | RESPIRATORY_TRACT | Status: DC | PRN

## 2021-03-17 MED ORDER — SACUBITRIL-VALSARTAN 24-26 MG PO TABS
1.0000 | ORAL_TABLET | Freq: Two times a day (BID) | ORAL | Status: DC
  Administered 2021-03-17 – 2021-03-19 (×5): 1 via ORAL
  Filled 2021-03-17 (×6): qty 1

## 2021-03-17 MED ORDER — PERFLUTREN LIPID MICROSPHERE
1.0000 mL | INTRAVENOUS | Status: AC | PRN
  Administered 2021-03-17: 2 mL via INTRAVENOUS
  Filled 2021-03-17: qty 10

## 2021-03-17 MED ORDER — FUROSEMIDE 10 MG/ML IJ SOLN
40.0000 mg | Freq: Once | INTRAMUSCULAR | Status: AC
  Administered 2021-03-17: 40 mg via INTRAVENOUS
  Filled 2021-03-17: qty 4

## 2021-03-17 NOTE — Progress Notes (Signed)
PROGRESS NOTE    Henry Moreno  WPY:[id number] DOB: [birth year] DOA: 03/15/2021 PCP: [name], MD  Outpatient Specialists: [facility]    Brief Narrative:   Henry Moreno is a 70 y.o. male with h/o Afib on Eliquis, HFrEF, Vfib arrest s/p CRT-D, OSA on CPAP admitted with acute hypoxic respiratory failure and an episode of small volume hemoptysis. CTA chest showed diffuse bilateral alveolar groundglass opacities in all lobes of the lung concerning for atypical pneumonia versus diffuse alveolar hemorrhage versus alveolar pulmonary edema. Bronchoscopy was performed on 7/20 which showed no intraluminal bleeding and normal airway mucosa. BAL was clear and BAL cell count was not consistent with a diagnosis of DAH. Oxygen saturation and symptoms of shortness of breath improved with diuresis.   Assessment & Plan:   Active Problems:   Acute respiratory failure with hypoxia (HCC)  # Hemoptysis Small volume, resolved. Bronch neg. Pulm thinks most likely alverolar damage 2/2 volume overload and pulmonary edema - monitor - re-start doac - CHF tx as abelow - stop abx, bronch culture ngtd, will monitor culture  # HFrEF # Acute chf exacerbation # Acute hypoxic respiratory failure # History v fib, has ICD Most recent ef 30%. Has responded well to diuresis. Cxr this am shows improved pulmonary edema. Not on regular lasix at home - continue to wean o2 - lasix 40 iv once - f/u AM labs which ar pending - TTE ordered and pending - resume home entresto - cont home coreg  # Atrial fibrillation Here rate controlled - resume home eliquis - cont home amiodarone and coreg  # HTN Here normotensive - resume home meds  # OSA - can resume home cpap     DVT prophylaxis: eliquis Code Status: full Family Communication: none @ bedside  Level of care: Stepdown, will transfer to floor Status is: Inpatient  Remains inpatient appropriate because:Inpatient level of care appropriate  due to severity of illness  Dispo: The patient is from: Home              Anticipated d/c is to: Home              Patient currently is not medically stable to d/c.   Difficult to place patient No        Consultants:  pccm  Procedures: bronchoscopy  Antimicrobials:  S/p ceftriaxone/azithromycin    Subjective: This morning breathing much improved, no chest pain or hemoptysis  Objective: Vitals:   03/17/21 0700 03/17/21 0756 03/17/21 0800 03/17/21 0815  BP: 111/72  128/72   Pulse: 60 67 63   Resp: _0 Temp:    (!) 97.5 F (36.4 C)  TempSrc:    Oral  SpO2: 98% 98% 100% 93%  Weight:      Height:        Intake/Output Summary (Last 24 hours) at 03/17/2021 0939 Last data filed at 03/17/2021 0800 Gross per 24 hour  Intake 980 ml  Output 2925 ml  Net -1945 ml   Filed Weights   03/15/21 1510  Weight: 90.7 kg    Examination:  General exam: Appears calm and comfortable  Respiratory system: Clear to auscultation save for faint rales at bases. Respiratory effort normal. Cardiovascular system: S1 & S2 heard, RRR. No JVD, murmurs, rubs, gallops or clicks. trace pedal edema. Gastrointestinal system: Abdomen is nondistended, soft and nontender. No organomegaly or masses felt. Normal bowel sounds heard. Central nervous system: Alert and oriented. No focal neurological deficits. Extremities: Symmetric 5  x 5 power. Skin: No rashes, lesions or ulcers Psychiatry: Judgement and insight appear normal. Mood & affect appropriate.     Data Reviewed: I have personally reviewed following labs and imaging studies  CBC: Recent Labs  Lab 03/15/21 1514 03/16/21 0449  WBC 8.1 9.3  NEUTROABS 5.7  --   HGB 12.6* 11.6*  HCT 38.8* 36.3*  MCV 95.8 95.5  PLT 271 458   Basic Metabolic Panel: Recent Labs  Lab 03/15/21 1514 03/15/21 2007 03/16/21 0449  NA 136  --  136  K 4.5  --  4.6  CL 106  --  105  CO2 22  --  23  GLUCOSE 136*  --  139*  BUN 27*  --  20   CREATININE 0.92  --  0.83  CALCIUM 8.6*  --  8.6*  MG  --  2.2 2.1  PHOS  --  3.2 2.7   GFR: Estimated Creatinine Clearance: 91.8 mL/min (by C-G formula based on SCr of 0.83 mg/dL). Liver Function Tests: No results for input(s): AST, ALT, ALKPHOS, BILITOT, PROT, ALBUMIN in the last 168 hours. No results for input(s): LIPASE, AMYLASE in the last 168 hours. No results for input(s): AMMONIA in the last 168 hours. Coagulation Profile: Recent Labs  Lab 03/15/21 1514  INR 1.3*   Cardiac Enzymes: No results for input(s): CKTOTAL, CKMB, CKMBINDEX, TROPONINI in the last 168 hours. BNP (last 3 results) No results for input(s): PROBNP in the last 8760 hours. HbA1C: No results for input(s): HGBA1C in the last 72 hours. CBG: Recent Labs  Lab 03/15/21 2132  GLUCAP 89   Lipid Profile: No results for input(s): CHOL, HDL, LDLCALC, TRIG, CHOLHDL, LDLDIRECT in the last 72 hours. Thyroid Function Tests: No results for input(s): TSH, T4TOTAL, FREET4, T3FREE, THYROIDAB in the last 72 hours. Anemia Panel: No results for input(s): VITAMINB12, FOLATE, FERRITIN, TIBC, IRON, RETICCTPCT in the last 72 hours. Urine analysis:    Component Value Date/Time   COLORURINE Yellow 09/18/2012 0020   APPEARANCEUR Hazy 09/18/2012 0020   LABSPEC 1.025 09/18/2012 0020   PHURINE 5.0 09/18/2012 0020   GLUCOSEU Negative 09/18/2012 0020   HGBUR Negative 09/18/2012 0020   BILIRUBINUR Negative 09/18/2012 0020   KETONESUR Negative 09/18/2012 0020   PROTEINUR Negative 09/18/2012 0020   NITRITE Negative 09/18/2012 0020   LEUKOCYTESUR Negative 09/18/2012 0020   Sepsis Labs: _0 (procalcitonin:4,lacticidven:4)  ) Recent Results (from the past 240 hour(s))  Resp Panel by RT-PCR (Flu A&B, Covid) Nasopharyngeal Swab     Status: None   Collection Time: 03/15/21  3:35 PM   Specimen: Nasopharyngeal Swab; Nasopharyngeal(NP) swabs in vial transport medium  Result Value Ref Range Status   SARS Coronavirus 2 by  RT PCR NEGATIVE NEGATIVE Final    Comment: (NOTE) SARS-CoV-2 target nucleic acids are NOT DETECTED.  The SARS-CoV-2 RNA is generally detectable in upper respiratory specimens during the acute phase of infection. The lowest concentration of SARS-CoV-2 viral copies this assay can detect is 138 copies/mL. A negative result does not preclude SARS-Cov-2 infection and should not be used as the sole basis for treatment or other patient management decisions. A negative result may occur with  improper specimen collection/handling, submission of specimen other than nasopharyngeal swab, presence of viral mutation(s) within the areas targeted by this assay, and inadequate number of viral copies(<138 copies/mL). A negative result must be combined with clinical observations, patient history, and epidemiological information. The expected result is Negative.  Fact Sheet for Patients:  EntrepreneurPulse.com.au  Fact Sheet  for Healthcare Providers:  IncredibleEmployment.be  This test is no t yet approved or cleared by the [place] and  has been authorized for detection and/or diagnosis of SARS-CoV-2 by FDA under an Emergency Use Authorization (EUA). This EUA will remain  in effect (meaning this test can be used) for the duration of the COVID-19 declaration under Section 564(b)(1) of the Act, 21 U.S.C.section 360bbb-3(b)(1), unless the authorization is terminated  or revoked sooner.       Influenza A by PCR NEGATIVE NEGATIVE Final   Influenza B by PCR NEGATIVE NEGATIVE Final    Comment: (NOTE) The Xpert Xpress SARS-CoV-2/FLU/RSV plus assay is intended as an aid in the diagnosis of influenza from Nasopharyngeal swab specimens and should not be used as a sole basis for treatment. Nasal washings and aspirates are unacceptable for Xpert Xpress SARS-CoV-2/FLU/RSV testing.  Fact Sheet for Patients: EntrepreneurPulse.com.au  Fact Sheet  for Healthcare Providers: IncredibleEmployment.be  This test is not yet approved or cleared by the [place] FDA and has been authorized for detection and/or diagnosis of SARS-CoV-2 by FDA under an Emergency Use Authorization (EUA). This EUA will remain in effect (meaning this test can be used) for the duration of the COVID-19 declaration under Section 564(b)(1) of the Act, 21 U.S.C. section 360bbb-3(b)(1), unless the authorization is terminated or revoked.  Performed at [facility], [address] [place], [place] [address]   Blood culture (routine x 2)     Status: None (Preliminary result)   Collection Time: 03/15/21  5:31 PM   Specimen: BLOOD  Result Value Ref Range Status   Specimen Description BLOOD LW  Final   Special Requests   Final    BOTTLES DRAWN AEROBIC AND ANAEROBIC Blood Culture adequate volume   Culture   Final    NO GROWTH 2 DAYS Performed at [facility], [address] [place], [place] [address]    Report Status PENDING  Incomplete  Blood culture (routine x 2)     Status: None (Preliminary result)   Collection Time: 03/15/21  5:31 PM   Specimen: BLOOD  Result Value Ref Range Status   Specimen Description BLOOD RH  Final   Special Requests   Final    BOTTLES DRAWN AEROBIC AND ANAEROBIC Blood Culture results may not be optimal due to an inadequate volume of blood received in culture bottles   Culture   Final    NO GROWTH 2 DAYS Performed at [facility], [address], [place], [place] [address]    Report Status PENDING  Incomplete  MRSA Next Gen by PCR, Nasal     Status: None   Collection Time: 03/15/21 11:53 PM   Specimen: Nasal Mucosa; Nasal Swab  Result Value Ref Range Status   MRSA by PCR Next Gen NOT DETECTED NOT DETECTED Final    Comment: (NOTE) The GeneXpert MRSA Assay (FDA approved for NASAL specimens only), is one component of a comprehensive MRSA colonization surveillance program. It is  not intended to diagnose MRSA infection nor to guide or monitor treatment for MRSA infections. Test performance is not FDA approved in patients less than 20 years old. Performed at [facility], [address] [place], [place] [address]   Culture, Respiratory w Gram Stain     Status: None (Preliminary result)   Collection Time: 03/16/21  9:25 AM   Specimen: Bronchoalveolar Lavage; Respiratory  Result Value Ref Range Status   Specimen Description   Final    BRONCHIAL ALVEOLAR  LAVAGE Performed at [facility], [address], [place], [place] [address]    Special Requests   Final    NONE Performed at [facility], [place], [place] [address]    Gram Stain NO WBC SEEN NO ORGANISMS SEEN   Final   Culture   Final    CULTURE REINCUBATED FOR BETTER GROWTH Performed at [facility], [place] [address], [place], [place] [address]    Report Status PENDING  Incomplete         Radiology Studies: DG Chest 2 View  Result Date: 03/17/2021 CLINICAL DATA:  Acute on chronic respiratory failure with hypoxia EXAM: CHEST - 2 VIEW COMPARISON:  03/15/2021 FINDINGS: Cardiac shadow is stable. Defibrillator is again noted. Previously seen changes of CHF and edema have resolved in the interval. No focal infiltrate or effusion is seen. No bony abnormality is noted. IMPRESSION: No acute abnormality is noted. Previously seen CHF has resolved in the interval. Electronically Signed   By: Inez Catalina M.D.   On: 03/17/2021 09:19   CT Angio Chest PE W and/or Wo Contrast  Result Date: 03/15/2021 CLINICAL DATA:  Hemoptysis and shortness of breath. EXAM: CT ANGIOGRAPHY CHEST WITH CONTRAST TECHNIQUE: Multidetector CT imaging of the chest was performed using the standard protocol during bolus administration of intravenous contrast. Multiplanar CT image reconstructions and MIPs were obtained to evaluate the vascular anatomy. CONTRAST:  57m OMNIPAQUE IOHEXOL 350 MG/ML  SOLN COMPARISON:  None. FINDINGS: Cardiovascular: Pulmonary arteries are adequately opacified. There is some respiratory motion artifact throughout the study but significant pulmonary embolism is felt to be excluded. Central pulmonary arteries are normal in caliber. The thoracic aorta is normal in caliber. A biventricular pacing/ICD device present with appropriate positioning of leads. Left ventricular cavity appears dilated. No pericardial fluid. Mediastinum/Nodes: No enlarged mediastinal, hilar, or axillary lymph nodes. Thyroid gland, trachea, and esophagus demonstrate no significant findings. Lungs/Pleura: Diffuse alveolar pulmonary opacity present throughout both upper lobes and in the superior segments of both lower lobes with some relative sparing of the lung bases. Associated trace amount right pleural fluid. Findings are suggestive of bilateral pulmonary hemorrhage. Component of pulmonary edema may also be present but there is no significant engorgement pulmonary arteries or veins. Component of underlying infection is not excluded such as an atypical infectious process. Upper Abdomen: No acute abnormality. Musculoskeletal: No chest wall abnormality. No acute or significant osseous findings. Review of the MIP images confirms the above findings. IMPRESSION: 1. No evidence of pulmonary embolism. 2. Diffuse alveolar pulmonary opacity throughout both upper lobes and the superior segments of both lower lobes with trace right pleural fluid. Findings are suggestive of pulmonary hemorrhage. Component of atypical pneumonia or pulmonary edema may also be present. Electronically Signed   By: GAletta EdouardM.D.   On: 03/15/2021 16:53   DG Chest Portable 1 View  Result Date: 03/15/2021 CLINICAL DATA:  Chest pain and shortness of breath EXAM: PORTABLE CHEST 1 VIEW COMPARISON:  11/12/2017 FINDINGS: Cardiac shadow is enlarged. Defibrillator is again noted and stable. Increased vascular congestion is noted with patchy  airspace opacity likely representing edema. No sizable effusion is seen. No pneumothorax is noted. IMPRESSION: Changes consistent with CHF and patchy parenchymal edema bilaterally. Electronically Signed   By: MInez CatalinaM.D.   On: 03/15/2021 16:02        Scheduled Meds:  acetaminophen  1,000 mg Oral BID   amiodarone  200 mg Oral Daily   apixaban  5  mg Oral BID   carvedilol  12.5 mg Oral BID WC   dextromethorphan-guaiFENesin  1 tablet Oral BID   ipratropium-albuterol  3 mL Nebulization TID   loratadine  10 mg Oral Daily   magnesium oxide  200 mg Oral QHS   Continuous Infusions:  azithromycin 500 mg (03/16/21 2026)   cefTRIAXone (ROCEPHIN)  IV Stopped (03/16/21 1531)     LOS: 2 days    Time spent: 26 min    Desma Maxim, MD [facility]   If 7PM-7AM, please contact night-coverage [email or web address] Password [facility] 03/17/2021, 9:39 AM

## 2021-03-17 NOTE — Progress Notes (Signed)
NAME:  Henry Moreno, MRN:  [id number], DOB:  1950-11-23, LOS: 2 ADMISSION DATE:  03/15/2021, CONSULTATION DATE:  03/15/2021 REFERRING MD:  Archie Balboa, CHIEF COMPLAINT:  Hemoptysis   History of Present Illness:  Henry Moreno is a 70 y.o. male with h/o Afib on Eliquis, HFrEF, Vfib arrest s/p CRT-D, OSA on CPAP admitted with acute hypoxic respiratory failure and an episode of small volume hemoptysis. CTA chest showed diffuse bilateral alveolar groundglass opacities in all lobes of the lung concerning for atypical pneumonia versus diffuse alveolar hemorrhage versus alveolar pulmonary edema. Bronchoscopy was performed on 7/20 which showed no intraluminal bleeding and normal airway mucosa. BAL was clear and BAL cell count was not consistent with a diagnosis of DAH. Oxygen saturation and symptoms of shortness of breath improved with diuresis.  Pertinent  Medical History  Atrial fibrillation on Eliquis OSA on CPAP V. fib arrest s/p ICD Chronic combined systolic and diastolic heart failure HTN  Significant Hospital Events: Including procedures, antibiotic start and stop dates in addition to other pertinent events   7/20 bronchoscopy performed, revealed no bleeding, serial alliquots not consistent with DAH  Interim History / Subjective:  No further episodes of hemoptysis. Symptoms are improving with diuresis. BAL is very bland -- fairly inconsistent with infection and not at all consistent with DAH. He continues to have mild cough. On 2L/min supplemental oxygen this morning.  Objective   Blood pressure 128/72, pulse 63, temperature (!) 97.5 F (36.4 C), temperature source Oral, resp. rate 17, height _0  (1.727 m), weight 90.7 kg, SpO2 93 %.        Intake/Output Summary (Last 24 hours) at 03/17/2021 0830 Last data filed at 03/17/2021 0200 Gross per 24 hour  Intake 980 ml  Output 2750 ml  Net -1770 ml   Filed Weights   03/15/21 1510  Weight: 90.7 kg    Examination: General: Caucasian  male in NAD HENT: pupils equal and reactive, moist oral mucus membranes Lungs: scant rales at the bases bilaterally, much improved as compared to yesterday's exam Cardiovascular: RRR, no M/R/G Abdomen: soft, non-tender, non-distended Extremities: trace pitting edema of BLE Neuro: awake, alert and oriented, no deficits GU: deferred  Resolved Hospital Problem list   Hemoptysis  Assessment & Plan:  Bilateral alveolar groundglass opacities Hemoptysis Hemoptysis resolved after single episode. Unclear etiology as no source of bleeding or mucosal abnormality noted on bronchoscopy. The CT findings along with hemoptysis can be consistent with diffuse alveolar damage in the setting of volume overload and alveolar pulmonary edema. It is reasonable to continue antibiotics until cultures result. Sent ANA and ANCAs although bronch not consistent with a diagnosis of DAH or inflammatory pneumonia. - Continue diuresis, goal 1L negative - Obtain CXR PA&Lat - Continue CAP coverage; f/u culture data - F/u ANA, ANCAs, anti-GBM - OK to restart Eliquis; monitor for additional episodes of hemoptysis - Ensure that daily Lasix is continued on discharge - Would repeat CT chest in 2-4 weeks to ensure resolution  HFrEF s/p CRT-D - Obtain TTE - Check BNP - Lasix as above - Continue home Coreg  Afib on Eliquis - OK to resume Eliquis today - Continue home beta blockade  Best Practice (right click and "Reselect all SmartList Selections" daily)   Diet/type: Regular consistency (see orders) DVT prophylaxis: DOAC GI prophylaxis: N/A Lines: N/A Foley:  N/A Code Status:  full code  Labs   CBC: Recent Labs  Lab 03/15/21 1514 03/16/21 0449  WBC 8.1 9.3  NEUTROABS 5.7  --  HGB 12.6* 11.6*  HCT 38.8* 36.3*  MCV 95.8 95.5  PLT 271 932    Basic Metabolic Panel: Recent Labs  Lab 03/15/21 1514 03/15/21 2007 03/16/21 0449  NA 136  --  136  K 4.5  --  4.6  CL 106  --  105  CO2 22  --  23  GLUCOSE  136*  --  139*  BUN 27*  --  20  CREATININE 0.92  --  0.83  CALCIUM 8.6*  --  8.6*  MG  --  2.2 2.1  PHOS  --  3.2 2.7   GFR: Estimated Creatinine Clearance: 91.8 mL/min (by C-G formula based on SCr of 0.83 mg/dL). Recent Labs  Lab 03/15/21 1514 03/15/21 2007 03/15/21 2345 03/16/21 0449  PROCALCITON  --  <0.10  --   --   WBC 8.1  --   --  9.3  LATICACIDVEN  --  1.1 1.0  --     Liver Function Tests: No results for input(s): AST, ALT, ALKPHOS, BILITOT, PROT, ALBUMIN in the last 168 hours. No results for input(s): LIPASE, AMYLASE in the last 168 hours. No results for input(s): AMMONIA in the last 168 hours.  ABG No results found for: PHART, PCO2ART, PO2ART, HCO3, TCO2, ACIDBASEDEF, O2SAT   Coagulation Profile: Recent Labs  Lab 03/15/21 1514  INR 1.3*    Cardiac Enzymes: No results for input(s): CKTOTAL, CKMB, CKMBINDEX, TROPONINI in the last 168 hours.  HbA1C: No results found for: HGBA1C  CBG: Recent Labs  Lab 03/15/21 2132  GLUCAP 89    Review of Systems:   Pertinent findings noted in HPI. Comprehensive review of systems completed and all other systems negative unless otherwise documented.  Past Medical History:  He,  has a past medical history of Anemia, Asthma, Atrial fibrillation ([place]), Cardiac arrest ([place]), CHF (congestive heart failure) ([place]), Chronic pain, DDD (degenerative disc disease), cervical, Hypertension, ICD (implantable cardioverter-defibrillator) in place, NICM (nonischemic cardiomyopathy) ([place]), and Sleep apnea.   Surgical History:   Past Surgical History:  Procedure Laterality Date   Automatic Implantable Cardiac Defibrillator in situ  2013   Medtronic [id number] biventricular defibrillator with Medtronic 5076 atrial lead.   CARDIAC CATHETERIZATION     COLONOSCOPY N/A 08/30/2020   Procedure: COLONOSCOPY;  Surgeon: Toledo, Benay Pike, MD;  Location: [facility];  Service: Gastroenterology;  Laterality: N/A;   ESOPHAGOGASTRODUODENOSCOPY (EGD)  WITH PROPOFOL N/A 08/30/2020   Procedure: ESOPHAGOGASTRODUODENOSCOPY (EGD) WITH PROPOFOL;  Surgeon: Toledo, Benay Pike, MD;  Location: [facility];  Service: Gastroenterology;  Laterality: N/A;   HERNIA REPAIR     INSERT / REPLACE / REMOVE PACEMAKER     LEFT HEART CATH AND CORONARY ANGIOGRAPHY N/A 11/14/2017   Procedure: LEFT HEART CATH AND CORONARY ANGIOGRAPHY;  Surgeon: [name], MD;  Location: [place] [facility];  Service: Cardiovascular;  Laterality: N/A;   LIPOMA EXCISION     TONSILLECTOMY     ULTRASOUND GUIDANCE FOR VASCULAR ACCESS  11/14/2017   Procedure: Ultrasound Guidance For Vascular Access;  Surgeon: [name], MD;  Location: [place] [facility];  Service: Cardiovascular;;     Social History:   reports that he has never smoked. He has never used smokeless tobacco. He reports that he does not drink alcohol and does not use drugs.   Family History:  His family history is not on file. He was adopted.   Allergies Allergies  Allergen Reactions   Ace Inhibitors Cough   Ibuprofen Other (See Comments)  Other Reaction: HIVES / RASH     Home Medications  Prior to Admission medications   Medication Sig Start Date End Date Taking? Authorizing Provider  acetaminophen (TYLENOL) 500 MG tablet Take 1,000 mg by mouth 2 (two) times daily.   Yes [provider]  amiodarone (PACERONE) 200 MG tablet Take 1 tablet (200 mg total) by mouth daily. 12/26/17  Yes Baldwin Jamaica, PA-C  apixaban (ELIQUIS) 5 MG TABS tablet Take 5 mg by mouth 2 (two) times daily.  10/14/15  Yes [provider]  carvedilol (COREG) 25 MG tablet TAKE 1 TABLET(25 MG) BY MOUTH TWICE DAILY WITH A MEAL 12/26/17  Yes Camnitz, Will Hassell Done, MD  cetirizine (ZYRTEC) 10 MG tablet Take 10 mg by mouth at bedtime.  02/15/10  Yes [provider]  Magnesium 200 MG TABS Take 200 mg by mouth at bedtime.    Yes [provider]  sacubitril-valsartan (ENTRESTO) 24-26 MG Take 1 tablet  by mouth 2 (two) times daily.   Yes [provider]  azelastine (ASTELIN) 0.1 % nasal spray Place 1 spray into both nostrils as needed. Patient not taking: Reported on 03/15/2021 10/01/17   [provider]  benzonatate (TESSALON) 200 MG capsule Take 200 mg by mouth 3 (three) times daily as needed for cough. Patient not taking: Reported on 03/15/2021 11/03/17   [provider]  Cholecalciferol (VITAMIN D3) 3000 units TABS Take 1 capsule by mouth daily. Taking a  2036m dose 02/15/10   [provider]  furosemide (LASIX) 20 MG tablet Take 1 tablet (20 mg total) by mouth daily as needed for fluid or edema (for weight gain of 3pounds in 24 hours or 5 pound in a week as discussed). Patient not taking: Reported on 03/15/2021 11/15/17 03/15/21  UBaldwin Jamaica PA-C  sacubitril-valsartan (ENTRESTO) 49-51 MG Take 1 tablet by mouth 2 (two) times daily. Patient not taking: Reported on 03/15/2021 12/27/17   CConstance Haw MD     ABennie Pierini MD 03/17/21 8:50 AM

## 2021-03-17 NOTE — Progress Notes (Signed)
*  PRELIMINARY RESULTS* Echocardiogram 2D Echocardiogram has been performed.  Henry Moreno 03/17/2021, 11:21 AM

## 2021-03-18 DIAGNOSIS — R042 Hemoptysis: Secondary | ICD-10-CM | POA: Diagnosis not present

## 2021-03-18 LAB — CULTURE, RESPIRATORY W GRAM STAIN
Culture: NORMAL
Gram Stain: NONE SEEN

## 2021-03-18 LAB — LEGIONELLA PNEUMOPHILA SEROGP 1 UR AG: L. pneumophila Serogp 1 Ur Ag: NEGATIVE

## 2021-03-18 LAB — BASIC METABOLIC PANEL
Anion gap: 8 (ref 5–15)
BUN: 50 mg/dL — ABNORMAL HIGH (ref 8–23)
CO2: 26 mmol/L (ref 22–32)
Calcium: 8.7 mg/dL — ABNORMAL LOW (ref 8.9–10.3)
Chloride: 104 mmol/L (ref 98–111)
Creatinine, Ser: 0.96 mg/dL (ref 0.61–1.24)
GFR, Estimated: >60 mL/min (ref >60)
Glucose, Bld: 101 mg/dL — ABNORMAL HIGH (ref 70–99)
Potassium: 3.6 mmol/L (ref 3.5–5.1)
Sodium: 138 mmol/L (ref 135–145)

## 2021-03-18 LAB — ACID FAST SMEAR (AFB, MYCOBACTERIA): Acid Fast Smear: NEGATIVE

## 2021-03-18 LAB — ANCA TITERS
Atypical P-ANCA titer: <1:20 {titer}
C-ANCA: <1:20 {titer}
P-ANCA: <1:20 {titer}

## 2021-03-18 MED ORDER — FUROSEMIDE 10 MG/ML IJ SOLN
40.0000 mg | Freq: Every day | INTRAMUSCULAR | Status: DC
  Administered 2021-03-19: 09:00:00 40 mg via INTRAVENOUS
  Filled 2021-03-18: qty 4

## 2021-03-18 MED ORDER — APIXABAN 5 MG PO TABS
5.0000 mg | ORAL_TABLET | Freq: Two times a day (BID) | ORAL | Status: DC
  Administered 2021-03-18 – 2021-03-19 (×2): 5 mg via ORAL
  Filled 2021-03-18 (×2): qty 1

## 2021-03-18 MED ORDER — FUROSEMIDE 10 MG/ML IJ SOLN
40.0000 mg | Freq: Once | INTRAMUSCULAR | Status: AC
  Administered 2021-03-18: 40 mg via INTRAVENOUS
  Filled 2021-03-18: qty 4

## 2021-03-18 MED ORDER — CARVEDILOL 25 MG PO TABS: 25.0000 mg | ORAL_TABLET | Freq: Two times a day (BID) | ORAL | Status: DC

## 2021-03-18 MED ORDER — CARVEDILOL 6.25 MG PO TABS
12.5000 mg | ORAL_TABLET | Freq: Once | ORAL | Status: DC
  Filled 2021-03-18 (×2): qty 2

## 2021-03-18 MED ORDER — CARVEDILOL 3.125 MG PO TABS
3.1250 mg | ORAL_TABLET | Freq: Two times a day (BID) | ORAL | Status: DC
  Administered 2021-03-18 – 2021-03-19 (×2): 3.125 mg via ORAL
  Filled 2021-03-18 (×2): qty 1

## 2021-03-18 NOTE — Consult Note (Signed)
ANTICOAGULATION CONSULT NOTE  Pharmacy Consult for Apixaban Indication: atrial fibrillation  Patient Measurements: Height: '5\' 8"'$  (172.7 cm) Weight: 90.7 kg (200 lb) IBW/kg (Calculated) : 68.4  Labs: Recent Labs    03/15/21 1731 03/16/21 0449 03/17/21 0922 03/18/21 0431  HGB  --  11.6* 11.5*  --   HCT  --  36.3* 35.1*  --   PLT  --  222 240  --   CREATININE  --  0.83 1.22 0.96  TROPONINIHS 19*  --   --   --     Estimated Creatinine Clearance: 79.4 mL/min (by C-G formula based on SCr of 0.96 mg/dL).   Medical History: Past Medical History:  Diagnosis Date   Anemia    Asthma    Atrial fibrillation (HCC)    Cardiac arrest (HCC)    CHF (congestive heart failure) (HCC)    Chronic pain    neck    DDD (degenerative disc disease), cervical    Hypertension    ICD (implantable cardioverter-defibrillator) in place    NICM (nonischemic cardiomyopathy) ([place])    Sleep apnea     Medications:  Apixaban 5 mg BID prior to admission  Assessment: Patient is a 70 y/o M with medical history as above and including atrial fibrillation on apixaban who is admitted with hemoptysis. Hemoptysis is thought to be secondary to alveolar damage from volume overload and pulmonary edema. Appears hemoptysis has resolved. Pharmacy consulted to re-initiate apixaban for Afib.   Plan:  --Resume apixaban 5 mg BID --CBC at least every 3 days per protocol --Monitor closely for hemoptysis  Benita Gutter 03/18/2021,5:24 PM

## 2021-03-18 NOTE — Progress Notes (Signed)
PROGRESS NOTE    [name]  BJY:[id number] DOB: 1950/10/22 DOA: 03/15/2021 PCP: [name], MD  Outpatient Specialists: [facility]    Brief Narrative:   Henry Moreno is a 70 y.o. male with h/o Afib on Eliquis, HFrEF, Vfib arrest s/p CRT-D, OSA on CPAP admitted with acute hypoxic respiratory failure and an episode of small volume hemoptysis. CTA chest showed diffuse bilateral alveolar groundglass opacities in all lobes of the lung concerning for atypical pneumonia versus diffuse alveolar hemorrhage versus alveolar pulmonary edema. Bronchoscopy was performed on 7/20 which showed no intraluminal bleeding and normal airway mucosa. BAL was clear and BAL cell count was not consistent with a diagnosis of DAH. Oxygen saturation and symptoms of shortness of breath improved with diuresis.   Assessment & Plan:   Principal Problem:   Hemoptysis Active Problems:   Atrial fibrillation (HCC)   Chronic combined systolic and diastolic heart failure (HCC)   Essential (primary) hypertension   Ventricular fibrillation (HCC)   Acute respiratory failure with hypoxia ([place])  # Hemoptysis Pt reports prior to admission had clots big enough to fill mouth. Had resolved yesterday but overnight and this morning small volume hemoptysis. No nose bleed or other bleeding. Bronch neg. Pulm thinks most likely alverolar damage 2/2 volume overload and pulmonary edema, advises no further w/u - CTM - hold eliquis - CHF tx as abelow  # HFrEF # Acute chf exacerbation # Acute hypoxic respiratory failure # History v fib, has ICD EF 25-30%, followed @ [facility]. Has responded well to diuresis.  Not on regular lasix at home. Weaned off O2. HR elevated today but bp soft - lasix 40 iv qd - resumed home entresto - cont home coreg - pt has requested cardiology consultation which I have ordered  # Atrial fibrillation Here rate controlled, hr up to low 100s this morning - eliquis on hold - cont home  amiodarone and coreg - cards consult as above  # HTN Here bp low normal - chf meds as above  # OSA -  home cpap   DVT prophylaxis: scds Code Status: full Family Communication: wife updated @ bedside 7/22  Level of care: Med-Surg Status is: Inpatient  Remains inpatient appropriate because:Inpatient level of care appropriate due to severity of illness  Dispo: The patient is from: Home              Anticipated d/c is to: Home              Patient currently is not medically stable to d/c.   Difficult to place patient No        Consultants:  Pccm, cardiology  Procedures: bronchoscopy  Antimicrobials:  S/p ceftriaxone/azithromycin    Subjective: This morning breathing well, had several episodes small volume hemoptysis last night and this morning  Objective: Vitals:   03/17/21 2133 03/17/21 2349 03/18/21 0339 03/18/21 0702  BP: 108/64 113/73 103/71 119/79  Pulse: 67 61 83 (!) 105  Resp: _0 Temp: 98.7 F (37.1 C) 97.8 F (36.6 C) 97.8 F (36.6 C) 97.6 F (36.4 C)  TempSrc:    Oral  SpO2: 92% 93% 98% 94%  Weight:      Height:        Intake/Output Summary (Last 24 hours) at 03/18/2021 0929 Last data filed at 03/18/2021 0800 Gross per 24 hour  Intake 480 ml  Output 1800 ml  Net -1320 ml   Filed Weights   03/15/21 1510  Weight: 90.7 kg  Examination:  General exam: Appears calm and comfortable  Respiratory system: Clear to auscultation save for faint rales at bases. Respiratory effort normal. Cardiovascular system: S1 & S2 heard, RRR. No JVD, murmurs, rubs, gallops or clicks. trace pedal edema. Gastrointestinal system: Abdomen is nondistended, soft and nontender. No organomegaly or masses felt. Normal bowel sounds heard. Central nervous system: Alert and oriented. No focal neurological deficits. Extremities: Symmetric 5 x 5 power. Skin: No rashes, lesions or ulcers Psychiatry: Judgement and insight appear normal. Mood & affect appropriate.      Data Reviewed: I have personally reviewed following labs and imaging studies  CBC: Recent Labs  Lab 03/15/21 1514 03/16/21 0449 03/17/21 0922  WBC 8.1 9.3 11.2*  NEUTROABS 5.7  --   --   HGB 12.6* 11.6* 11.5*  HCT 38.8* 36.3* 35.1*  MCV 95.8 95.5 95.4  PLT 271 222 324   Basic Metabolic Panel: Recent Labs  Lab 03/15/21 1514 03/15/21 2007 03/16/21 0449 03/17/21 0922 03/18/21 0431  NA 136  --  136 137 138  K 4.5  --  4.6 4.2 3.6  CL 106  --  105 106 104  CO2 22  --  _0 GLUCOSE 136*  --  139* 100* 101*  BUN 27*  --  20 39* 50*  CREATININE 0.92  --  0.83 1.22 0.96  CALCIUM 8.6*  --  8.6* 8.9 8.7*  MG  --  2.2 2.1  --   --   PHOS  --  3.2 2.7  --   --    GFR: Estimated Creatinine Clearance: 79.4 mL/min (by C-G formula based on SCr of 0.96 mg/dL). Liver Function Tests: No results for input(s): AST, ALT, ALKPHOS, BILITOT, PROT, ALBUMIN in the last 168 hours. No results for input(s): LIPASE, AMYLASE in the last 168 hours. No results for input(s): AMMONIA in the last 168 hours. Coagulation Profile: Recent Labs  Lab 03/15/21 1514  INR 1.3*   Cardiac Enzymes: No results for input(s): CKTOTAL, CKMB, CKMBINDEX, TROPONINI in the last 168 hours. BNP (last 3 results) No results for input(s): PROBNP in the last 8760 hours. HbA1C: No results for input(s): HGBA1C in the last 72 hours. CBG: Recent Labs  Lab 03/15/21 2132  GLUCAP 89   Lipid Profile: No results for input(s): CHOL, HDL, LDLCALC, TRIG, CHOLHDL, LDLDIRECT in the last 72 hours. Thyroid Function Tests: No results for input(s): TSH, T4TOTAL, FREET4, T3FREE, THYROIDAB in the last 72 hours. Anemia Panel: No results for input(s): VITAMINB12, FOLATE, FERRITIN, TIBC, IRON, RETICCTPCT in the last 72 hours. Urine analysis:    Component Value Date/Time   COLORURINE Yellow 09/18/2012 0020   APPEARANCEUR Hazy 09/18/2012 0020   LABSPEC 1.025 09/18/2012 0020   PHURINE 5.0 09/18/2012 0020   GLUCOSEU  Negative 09/18/2012 0020   HGBUR Negative 09/18/2012 0020   BILIRUBINUR Negative 09/18/2012 0020   KETONESUR Negative 09/18/2012 0020   PROTEINUR Negative 09/18/2012 0020   NITRITE Negative 09/18/2012 0020   LEUKOCYTESUR Negative 09/18/2012 0020   Sepsis Labs: _1 (procalcitonin:4,lacticidven:4)  ) Recent Results (from the past 240 hour(s))  Resp Panel by RT-PCR (Flu A&B, Covid) Nasopharyngeal Swab     Status: None   Collection Time: 03/15/21  3:35 PM   Specimen: Nasopharyngeal Swab; Nasopharyngeal(NP) swabs in vial transport medium  Result Value Ref Range Status   SARS Coronavirus 2 by RT PCR NEGATIVE NEGATIVE Final    Comment: (NOTE) SARS-CoV-2 target nucleic acids are NOT DETECTED.  The SARS-CoV-2 RNA is generally detectable  in upper respiratory specimens during the acute phase of infection. The lowest concentration of SARS-CoV-2 viral copies this assay can detect is 138 copies/mL. A negative result does not preclude SARS-Cov-2 infection and should not be used as the sole basis for treatment or other patient management decisions. A negative result may occur with  improper specimen collection/handling, submission of specimen other than nasopharyngeal swab, presence of viral mutation(s) within the areas targeted by this assay, and inadequate number of viral copies(<138 copies/mL). A negative result must be combined with clinical observations, patient history, and epidemiological information. The expected result is Negative.  Fact Sheet for Patients:  EntrepreneurPulse.com.au  Fact Sheet for Healthcare Providers:  IncredibleEmployment.be  This test is no t yet approved or cleared by the [place] FDA and  has been authorized for detection and/or diagnosis of SARS-CoV-2 by FDA under an Emergency Use Authorization (EUA). This EUA will remain  in effect (meaning this test can be used) for the duration of the COVID-19 declaration  under Section 564(b)(1) of the Act, 21 U.S.C.section 360bbb-3(b)(1), unless the authorization is terminated  or revoked sooner.       Influenza A by PCR NEGATIVE NEGATIVE Final   Influenza B by PCR NEGATIVE NEGATIVE Final    Comment: (NOTE) The Xpert Xpress SARS-CoV-2/FLU/RSV plus assay is intended as an aid in the diagnosis of influenza from Nasopharyngeal swab specimens and should not be used as a sole basis for treatment. Nasal washings and aspirates are unacceptable for Xpert Xpress SARS-CoV-2/FLU/RSV testing.  Fact Sheet for Patients: EntrepreneurPulse.com.au  Fact Sheet for Healthcare Providers: IncredibleEmployment.be  This test is not yet approved or cleared by the [place] FDA and has been authorized for detection and/or diagnosis of SARS-CoV-2 by FDA under an Emergency Use Authorization (EUA). This EUA will remain in effect (meaning this test can be used) for the duration of the COVID-19 declaration under Section 564(b)(1) of the Act, 21 U.S.C. section 360bbb-3(b)(1), unless the authorization is terminated or revoked.  Performed at [facility], [address] [place], [place] [address]   Blood culture (routine x 2)     Status: None (Preliminary result)   Collection Time: 03/15/21  5:31 PM   Specimen: BLOOD  Result Value Ref Range Status   Specimen Description BLOOD LW  Final   Special Requests   Final    BOTTLES DRAWN AEROBIC AND ANAEROBIC Blood Culture adequate volume   Culture   Final    NO GROWTH 3 DAYS Performed at [facility], [address] [place], [place] [address]    Report Status PENDING  Incomplete  Blood culture (routine x 2)     Status: None (Preliminary result)   Collection Time: 03/15/21  5:31 PM   Specimen: BLOOD  Result Value Ref Range Status   Specimen Description BLOOD RH  Final   Special Requests   Final    BOTTLES DRAWN AEROBIC AND ANAEROBIC Blood Culture results may not be  optimal due to an inadequate volume of blood received in culture bottles   Culture   Final    NO GROWTH 3 DAYS Performed at [facility], [address], [place],  [address]    Report Status PENDING  Incomplete  MRSA Next Gen by PCR, Nasal     Status: None   Collection Time: 03/15/21 11:53 PM   Specimen: Nasal Mucosa; Nasal Swab  Result Value Ref Range Status   MRSA by PCR Next Gen NOT DETECTED NOT DETECTED Final  Comment: (NOTE) The GeneXpert MRSA Assay (FDA approved for NASAL specimens only), is one component of a comprehensive MRSA colonization surveillance program. It is not intended to diagnose MRSA infection nor to guide or monitor treatment for MRSA infections. Test performance is not FDA approved in patients less than 39 years old. Performed at [facility], [address] [place], [place] [address]   Culture, Respiratory w Gram Stain     Status: None (Preliminary result)   Collection Time: 03/16/21  9:25 AM   Specimen: Bronchoalveolar Lavage; Respiratory  Result Value Ref Range Status   Specimen Description   Final    BRONCHIAL ALVEOLAR LAVAGE Performed at [facility], [address] [place], [place] [address]    Special Requests   Final    NONE Performed at [facility], [address] [place], [place] [address]    Gram Stain NO WBC SEEN NO ORGANISMS SEEN   Final   Culture   Final    CULTURE REINCUBATED FOR BETTER GROWTH Performed at [facility], [place] [address], [place], [place] [address]    Report Status PENDING  Incomplete         Radiology Studies: DG Chest 2 View  Result Date: 03/17/2021 CLINICAL DATA:  Acute on chronic respiratory failure with hypoxia EXAM: CHEST - 2 VIEW COMPARISON:  03/15/2021 FINDINGS: Cardiac shadow is stable. Defibrillator is again noted. Previously seen changes of CHF and edema have resolved in the interval. No focal infiltrate or effusion is seen. No bony abnormality is  noted. IMPRESSION: No acute abnormality is noted. Previously seen CHF has resolved in the interval. Electronically Signed   By: Inez Catalina M.D.   On: 03/17/2021 09:19   ECHOCARDIOGRAM COMPLETE BUBBLE STUDY  Result Date: 03/17/2021    ECHOCARDIOGRAM REPORT   Patient Name:   Ladarian Zeigler Date of Exam: 03/17/2021 Medical Rec #:  [id number]       Height:       68.0 in Accession #:    [id number]      Weight:       200.0 lb Date of Birth:  Jan 06, 1951       BSA:          2.044 m Patient Age:    62 years        BP:           113/56 mmHg Patient Gender: M               HR:           67 bpm. Exam Location:  [facility] Procedure: 2D Echo, Cardiac Doppler, Color Doppler, Saline Contrast Bubble Study            and Intracardiac Opacification Agent Indications:     Systolic heart failure  History:         Patient has prior history of Echocardiogram examinations, most                  recent 11/13/2017. Arrythmias:Atrial Fibrillation; Risk                  Factors:Hypertension. Non-ischemic cardiomyopathy.  Sonographer:     Sherrie Sport RDCS (AE) Referring Phys:  [id number] ADAM Sharene Butters Diagnosing Phys: Ida Rogue MD  Sonographer Comments: Suboptimal apical window. IMPRESSIONS  1. Left ventricular ejection fraction, by estimation, is 25 to 30%. The left ventricle has severely decreased function. The left ventricle demonstrates global hypokinesis. The left ventricular internal cavity size  was moderately dilated. Left ventricular diastolic parameters are indeterminate.  2. Right ventricular systolic function is normal. The right ventricular size is normal.  3. Left atrial size was moderately dilated.  4. The mitral valve is normal in structure. Mild to moderate mitral valve regurgitation.  5. Agitated saline contrast bubble study was negative, with no evidence of any interatrial shunt. FINDINGS  Left Ventricle: Left ventricular ejection fraction, by estimation, is 25 to 30%. The left ventricle has severely decreased function.  The left ventricle demonstrates global hypokinesis. Definity contrast agent was given IV to delineate the left ventricular endocardial borders. The left ventricular internal cavity size was moderately dilated. There is no left ventricular hypertrophy. Left ventricular diastolic parameters are indeterminate. Right Ventricle: The right ventricular size is normal. No increase in right ventricular wall thickness. Right ventricular systolic function is normal. Left Atrium: Left atrial size was moderately dilated. Right Atrium: Right atrial size was normal in size. Pericardium: There is no evidence of pericardial effusion. Mitral Valve: The mitral valve is normal in structure. Mild to moderate mitral valve regurgitation. No evidence of mitral valve stenosis. Tricuspid Valve: The tricuspid valve is normal in structure. Tricuspid valve regurgitation is mild . No evidence of tricuspid stenosis. Aortic Valve: The aortic valve is normal in structure. Aortic valve regurgitation is not visualized. No aortic stenosis is present. Aortic valve mean gradient measures 3.0 mmHg. Aortic valve peak gradient measures 5.6 mmHg. Aortic valve area, by VTI measures 3.86 cm. Pulmonic Valve: The pulmonic valve was normal in structure. Pulmonic valve regurgitation is not visualized. No evidence of pulmonic stenosis. Aorta: The aortic root is normal in size and structure. Venous: The inferior vena cava is normal in size with greater than 50% respiratory variability, suggesting right atrial pressure of 3 mmHg. IAS/Shunts: No atrial level shunt detected by color flow Doppler. Agitated saline contrast was given intravenously to evaluate for intracardiac shunting. Agitated saline contrast bubble study was negative, with no evidence of any interatrial shunt. Additional Comments: A device lead is visualized.  LEFT VENTRICLE PLAX 2D LVIDd:         5.64 cm LVIDs:         4.36 cm LV PW:         1.09 cm LV IVS:        0.80 cm LVOT diam:     2.10 cm LV  SV:         78 LV SV Index:   38 LVOT Area:     3.46 cm  LV Volumes (MOD) LV vol d, MOD A2C: 224.0 ml LV vol d, MOD A4C: 257.0 ml LV vol s, MOD A2C: 160.0 ml LV vol s, MOD A4C: 214.0 ml LV SV MOD A2C:     64.0 ml LV SV MOD A4C:     257.0 ml LV SV MOD BP:      53.5 ml RIGHT VENTRICLE RV Basal diam:  3.24 cm LEFT ATRIUM             Index       RIGHT ATRIUM           Index LA diam:        5.20 cm 2.54 cm/m  RA Area:     16.30 cm LA Vol (A2C):   91.5 ml 44.77 ml/m RA Volume:   43.70 ml  21.38 ml/m LA Vol (A4C):   77.0 ml 37.68 ml/m LA Biplane Vol: 91.0 ml 44.53 ml/m  AORTIC VALVE  PULMONIC VALVE AV Area (Vmax):    3.02 cm    PV Vmax:        0.98 m/s AV Area (Vmean):   3.27 cm    PV Peak grad:   3.8 mmHg AV Area (VTI):     3.86 cm    RVOT Peak grad: 4 mmHg AV Vmax:           118.00 cm/s AV Vmean:          77.100 cm/s AV VTI:            0.201 m AV Peak Grad:      5.6 mmHg AV Mean Grad:      3.0 mmHg LVOT Vmax:         103.00 cm/s LVOT Vmean:        72.900 cm/s LVOT VTI:          0.224 m LVOT/AV VTI ratio: 1.11  AORTA Ao Root diam: 2.80 cm MITRAL VALVE                TRICUSPID VALVE MV Area (PHT): 3.53 cm     TR Peak grad:   36.0 mmHg MV Decel Time: 215 msec     TR Vmax:        300.00 cm/s MV E velocity: 101.00 cm/s MV A velocity: 50.10 cm/s   SHUNTS MV E/A ratio:  2.02         Systemic VTI:  0.22 m                             Systemic Diam: 2.10 cm Ida Rogue MD Electronically signed by Ida Rogue MD Signature Date/Time: 03/17/2021/5:50:33 PM    Final         Scheduled Meds:  acetaminophen  1,000 mg Oral BID   amiodarone  200 mg Oral Daily   apixaban  5 mg Oral BID   carvedilol  12.5 mg Oral Once   carvedilol  25 mg Oral BID WC   loratadine  10 mg Oral Daily   magnesium oxide  200 mg Oral QHS   sacubitril-valsartan  1 tablet Oral BID   Continuous Infusions:     LOS: 3 days    Time spent: 30 min    Desma Maxim, MD [facility]   If 7PM-7AM, please  contact night-coverage [email or web address] Password [facility] 03/18/2021, 9:29 AM

## 2021-03-18 NOTE — Consult Note (Signed)
CARDIOLOGY CONSULT NOTE               Patient ID: Henry Moreno MRN: [id number] DOB/AGE: 1951/08/13 70 y.o.  Admit date: 03/15/2021 Referring Physician [facility] Primary Physician Ramonita Lab, MD Primary Cardiologist [facility] Reason for Consultation chronic HFrEF  HPI: 70 year old gentleman referred for cardiology consult due to extensive cardiac history. The patient has a history of nonischemic cardiomyopathy, with LVEF 25-30% LBBB, status post CRT-D, ICD shocks for VF and VT, nonobstructive CAD per [facility] in 10/2017, atrial fibrillation, on Eliquis and amiodarone, hypertension, hyperlipidemia, obesity, and OSA. The patient presented to [facility] ER for a recent history of cough and hemoptysis with clots, without worsening peripheral edema, weight gain, shortness of breath, chest pain, or orthopnea. Chest CTA showed diffuse bilateral alveolar hemorrhage versus pulmonary edema versus atypical pneumonia. His Eliquis was held due to small volume hemoptysis. The patient underwent bronchoscopy, which was unremarkable. The patient had clinical improvement with diuresis. Repeat chest xray shows resolution of previously seen CHF exacerbation. Labs notable for BMP 382, which down-trended to 241. HS-troponin was 16 and 19. The patient's home Entresto and carvedilol were temporarily due to low blood pressure held while receiving IV Lasix. Delene Loll has been resumed, but carvedilol was held this morning due to low blood pressure, and patient notes that his baseline heart rate of 60s-80s has been in the 80-low 100s today. At this time, the patient reports feeling better, and denies chest pain or shortness of breath. His Eliquis is still on hold, but has been deemed appropriate to resume per pulmonology. He had a scant amount of hemoptysis this morning.   Review of systems complete and found to be negative unless listed above     Past Medical History:  Diagnosis Date   Anemia    Asthma    Atrial fibrillation ([place])     Cardiac arrest ([place])    CHF (congestive heart failure) (HCC)    Chronic pain    neck    DDD (degenerative disc disease), cervical    Hypertension    ICD (implantable cardioverter-defibrillator) in place    NICM (nonischemic cardiomyopathy) ([place])    Sleep apnea     Past Surgical History:  Procedure Laterality Date   Automatic Implantable Cardiac Defibrillator in situ  2013   Medtronic [id number] biventricular defibrillator with Medtronic 5076 atrial lead.   CARDIAC CATHETERIZATION     COLONOSCOPY N/A 08/30/2020   Procedure: COLONOSCOPY;  Surgeon: Toledo, Benay Pike, MD;  Location: [facility] ENDOSCOPY;  Service: Gastroenterology;  Laterality: N/A;   ESOPHAGOGASTRODUODENOSCOPY (EGD) WITH PROPOFOL N/A 08/30/2020   Procedure: ESOPHAGOGASTRODUODENOSCOPY (EGD) WITH PROPOFOL;  Surgeon: Toledo, Benay Pike, MD;  Location: [facility] ENDOSCOPY;  Service: Gastroenterology;  Laterality: N/A;   HERNIA REPAIR     INSERT / REPLACE / REMOVE PACEMAKER     LEFT HEART CATH AND CORONARY ANGIOGRAPHY N/A 11/14/2017   Procedure: LEFT HEART CATH AND CORONARY ANGIOGRAPHY;  Surgeon: [name], MD;  Location: [place] [facility];  Service: Cardiovascular;  Laterality: N/A;   LIPOMA EXCISION     TONSILLECTOMY     ULTRASOUND GUIDANCE FOR VASCULAR ACCESS  11/14/2017   Procedure: Ultrasound Guidance For Vascular Access;  Surgeon: [name], MD;  Location: [place] [facility];  Service: Cardiovascular;;    Medications Prior to Admission  Medication Sig Dispense Refill Last Dose   acetaminophen (TYLENOL) 500 MG tablet Take 1,000 mg by mouth 2 (two) times daily.   03/15/2021 at 1000   amiodarone (PACERONE) 200  MG tablet Take 1 tablet (200 mg total) by mouth daily. 90 tablet 3 03/14/2021 at 2000   apixaban (ELIQUIS) 5 MG TABS tablet Take 5 mg by mouth 2 (two) times daily.    03/15/2021 at 1000   carvedilol (COREG) 25 MG tablet TAKE 1 TABLET(25 MG) BY MOUTH TWICE DAILY WITH A MEAL 180 tablet 0 03/15/2021 at 1000   cetirizine  (ZYRTEC) 10 MG tablet Take 10 mg by mouth at bedtime.    03/14/2021 at 2000   Magnesium 200 MG TABS Take 200 mg by mouth at bedtime.    03/14/2021 at 2000   sacubitril-valsartan (ENTRESTO) 24-26 MG Take 1 tablet by mouth 2 (two) times daily.   03/15/2021 at 1000   azelastine (ASTELIN) 0.1 % nasal spray Place 1 spray into both nostrils as needed. (Patient not taking: Reported on 03/15/2021)  5 Not Taking   benzonatate (TESSALON) 200 MG capsule Take 200 mg by mouth 3 (three) times daily as needed for cough. (Patient not taking: Reported on 03/15/2021)  5 Not Taking   Cholecalciferol (VITAMIN D3) 3000 units TABS Take 1 capsule by mouth daily. Taking a  '2000mg'$  dose      furosemide (LASIX) 20 MG tablet Take 1 tablet (20 mg total) by mouth daily as needed for fluid or edema (for weight gain of 3pounds in 24 hours or 5 pound in a week as discussed). (Patient not taking: Reported on 03/15/2021) 30 tablet 1 Not Taking   sacubitril-valsartan (ENTRESTO) 49-51 MG Take 1 tablet by mouth 2 (two) times daily. (Patient not taking: Reported on 03/15/2021) 180 tablet 1 Not Taking   Social History   Socioeconomic History   Marital status: Married    Spouse name: Not on file   Number of children: Not on file   Years of education: Not on file   Highest education level: Not on file  Occupational History   Not on file  Tobacco Use   Smoking status: Never   Smokeless tobacco: Never  Vaping Use   Vaping Use: Never used  Substance and Sexual Activity   Alcohol use: No   Drug use: No   Sexual activity: Not on file  Other Topics Concern   Not on file  Social History Narrative   Not on file   Social Determinants of Health   Financial Resource Strain: Not on file  Food Insecurity: Not on file  Transportation Needs: Not on file  Physical Activity: Not on file  Stress: Not on file  Social Connections: Not on file  Intimate Partner Violence: Not on file    Family History  Adopted: Yes      Review of systems  complete and found to be negative unless listed above      PHYSICAL EXAM  General: Well developed, well nourished, in no acute distress, sitting up in recliner, smiling HEENT:  Normocephalic and atramatic Neck:  No JVD.  Lungs: Clear bilaterally to auscultation, normal effort of breathing on RA Heart: HRRR . Normal S1 and S2 without gallops or murmurs.  Abdomen: obese, nondistended, soft Extremities: No clubbing, cyanosis with trivial lower extremity edema.   Neuro: Alert and oriented X 3. Psych:  Good affect, responds appropriately  Labs:   Lab Results  Component Value Date   WBC 11.2 (H) 03/17/2021   HGB 11.5 (L) 03/17/2021   HCT 35.1 (L) 03/17/2021   MCV 95.4 03/17/2021   PLT 240 03/17/2021    Recent Labs  Lab 03/18/21 0431  NA 138  K 3.6  CL 104  CO2 26  BUN 50*  CREATININE 0.96  CALCIUM 8.7*  GLUCOSE 101*   Lab Results  Component Value Date   TROPONINI 1.60 ([place]) 11/13/2017   No results found for: CHOL No results found for: HDL No results found for: LDLCALC No results found for: TRIG No results found for: CHOLHDL No results found for: LDLDIRECT    Radiology: DG Chest 2 View  Result Date: 03/17/2021 CLINICAL DATA:  Acute on chronic respiratory failure with hypoxia EXAM: CHEST - 2 VIEW COMPARISON:  03/15/2021 FINDINGS: Cardiac shadow is stable. Defibrillator is again noted. Previously seen changes of CHF and edema have resolved in the interval. No focal infiltrate or effusion is seen. No bony abnormality is noted. IMPRESSION: No acute abnormality is noted. Previously seen CHF has resolved in the interval. Electronically Signed   By: Inez Catalina M.D.   On: 03/17/2021 09:19   CT Angio Chest PE W and/or Wo Contrast  Result Date: 03/15/2021 CLINICAL DATA:  Hemoptysis and shortness of breath. EXAM: CT ANGIOGRAPHY CHEST WITH CONTRAST TECHNIQUE: Multidetector CT imaging of the chest was performed using the standard protocol during bolus administration of intravenous  contrast. Multiplanar CT image reconstructions and MIPs were obtained to evaluate the vascular anatomy. CONTRAST:  65m OMNIPAQUE IOHEXOL 350 MG/ML SOLN COMPARISON:  None. FINDINGS: Cardiovascular: Pulmonary arteries are adequately opacified. There is some respiratory motion artifact throughout the study but significant pulmonary embolism is felt to be excluded. Central pulmonary arteries are normal in caliber. The thoracic aorta is normal in caliber. A biventricular pacing/ICD device present with appropriate positioning of leads. Left ventricular cavity appears dilated. No pericardial fluid. Mediastinum/Nodes: No enlarged mediastinal, hilar, or axillary lymph nodes. Thyroid gland, trachea, and esophagus demonstrate no significant findings. Lungs/Pleura: Diffuse alveolar pulmonary opacity present throughout both upper lobes and in the superior segments of both lower lobes with some relative sparing of the lung bases. Associated trace amount right pleural fluid. Findings are suggestive of bilateral pulmonary hemorrhage. Component of pulmonary edema may also be present but there is no significant engorgement pulmonary arteries or veins. Component of underlying infection is not excluded such as an atypical infectious process. Upper Abdomen: No acute abnormality. Musculoskeletal: No chest wall abnormality. No acute or significant osseous findings. Review of the MIP images confirms the above findings. IMPRESSION: 1. No evidence of pulmonary embolism. 2. Diffuse alveolar pulmonary opacity throughout both upper lobes and the superior segments of both lower lobes with trace right pleural fluid. Findings are suggestive of pulmonary hemorrhage. Component of atypical pneumonia or pulmonary edema may also be present. Electronically Signed   By: GAletta EdouardM.D.   On: 03/15/2021 16:53   DG Chest Portable 1 View  Result Date: 03/15/2021 CLINICAL DATA:  Chest pain and shortness of breath EXAM: PORTABLE CHEST 1 VIEW  COMPARISON:  11/12/2017 FINDINGS: Cardiac shadow is enlarged. Defibrillator is again noted and stable. Increased vascular congestion is noted with patchy airspace opacity likely representing edema. No sizable effusion is seen. No pneumothorax is noted. IMPRESSION: Changes consistent with CHF and patchy parenchymal edema bilaterally. Electronically Signed   By: MInez CatalinaM.D.   On: 03/15/2021 16:02   ECHOCARDIOGRAM COMPLETE BUBBLE STUDY  Result Date: 03/17/2021    ECHOCARDIOGRAM REPORT   Patient Name:   Rachit Tony Date of Exam: 03/17/2021 Medical Rec #:  [id number]      Height:       68.0 in Accession #:    [id number]  Weight:       200.0 lb Date of Birth:  [birth year]       BSA:          2.044 m Patient Age:    60 years        BP:           113/56 mmHg Patient Gender: M               HR:           67 bpm. Exam Location:  [facility] Procedure: 2D Echo, Cardiac Doppler, Color Doppler, Saline Contrast Bubble Study            and Intracardiac Opacification Agent Indications:     Systolic heart failure  History:         Patient has prior history of Echocardiogram examinations, most                  recent 11/13/2017. Arrythmias:Atrial Fibrillation; Risk                  Factors:Hypertension. Non-ischemic cardiomyopathy.  Sonographer:     Sherrie Sport RDCS (AE) Referring Phys:  [id number] ADAM Sharene Butters Diagnosing Phys: Ida Rogue MD  Sonographer Comments: Suboptimal apical window. IMPRESSIONS  1. Left ventricular ejection fraction, by estimation, is 25 to 30%. The left ventricle has severely decreased function. The left ventricle demonstrates global hypokinesis. The left ventricular internal cavity size was moderately dilated. Left ventricular diastolic parameters are indeterminate.  2. Right ventricular systolic function is normal. The right ventricular size is normal.  3. Left atrial size was moderately dilated.  4. The mitral valve is normal in structure. Mild to moderate mitral valve regurgitation.  5.  Agitated saline contrast bubble study was negative, with no evidence of any interatrial shunt. FINDINGS  Left Ventricle: Left ventricular ejection fraction, by estimation, is 25 to 30%. The left ventricle has severely decreased function. The left ventricle demonstrates global hypokinesis. Definity contrast agent was given IV to delineate the left ventricular endocardial borders. The left ventricular internal cavity size was moderately dilated. There is no left ventricular hypertrophy. Left ventricular diastolic parameters are indeterminate. Right Ventricle: The right ventricular size is normal. No increase in right ventricular wall thickness. Right ventricular systolic function is normal. Left Atrium: Left atrial size was moderately dilated. Right Atrium: Right atrial size was normal in size. Pericardium: There is no evidence of pericardial effusion. Mitral Valve: The mitral valve is normal in structure. Mild to moderate mitral valve regurgitation. No evidence of mitral valve stenosis. Tricuspid Valve: The tricuspid valve is normal in structure. Tricuspid valve regurgitation is mild . No evidence of tricuspid stenosis. Aortic Valve: The aortic valve is normal in structure. Aortic valve regurgitation is not visualized. No aortic stenosis is present. Aortic valve mean gradient measures 3.0 mmHg. Aortic valve peak gradient measures 5.6 mmHg. Aortic valve area, by VTI measures 3.86 cm. Pulmonic Valve: The pulmonic valve was normal in structure. Pulmonic valve regurgitation is not visualized. No evidence of pulmonic stenosis. Aorta: The aortic root is normal in size and structure. Venous: The inferior vena cava is normal in size with greater than 50% respiratory variability, suggesting right atrial pressure of 3 mmHg. IAS/Shunts: No atrial level shunt detected by color flow Doppler. Agitated saline contrast was given intravenously to evaluate for intracardiac shunting. Agitated saline contrast bubble study was negative,  with no evidence of any interatrial shunt. Additional Comments: A device lead is visualized.  LEFT VENTRICLE PLAX 2D  LVIDd:         5.64 cm LVIDs:         4.36 cm LV PW:         1.09 cm LV IVS:        0.80 cm LVOT diam:     2.10 cm LV SV:         78 LV SV Index:   38 LVOT Area:     3.46 cm  LV Volumes (MOD) LV vol d, MOD A2C: 224.0 ml LV vol d, MOD A4C: 257.0 ml LV vol s, MOD A2C: 160.0 ml LV vol s, MOD A4C: 214.0 ml LV SV MOD A2C:     64.0 ml LV SV MOD A4C:     257.0 ml LV SV MOD BP:      53.5 ml RIGHT VENTRICLE RV Basal diam:  3.24 cm LEFT ATRIUM             Index       RIGHT ATRIUM           Index LA diam:        5.20 cm 2.54 cm/m  RA Area:     16.30 cm LA Vol (A2C):   91.5 ml 44.77 ml/m RA Volume:   43.70 ml  21.38 ml/m LA Vol (A4C):   77.0 ml 37.68 ml/m LA Biplane Vol: 91.0 ml 44.53 ml/m  AORTIC VALVE                   PULMONIC VALVE AV Area (Vmax):    3.02 cm    PV Vmax:        0.98 m/s AV Area (Vmean):   3.27 cm    PV Peak grad:   3.8 mmHg AV Area (VTI):     3.86 cm    RVOT Peak grad: 4 mmHg AV Vmax:           118.00 cm/s AV Vmean:          77.100 cm/s AV VTI:            0.201 m AV Peak Grad:      5.6 mmHg AV Mean Grad:      3.0 mmHg LVOT Vmax:         103.00 cm/s LVOT Vmean:        72.900 cm/s LVOT VTI:          0.224 m LVOT/AV VTI ratio: 1.11  AORTA Ao Root diam: 2.80 cm MITRAL VALVE                TRICUSPID VALVE MV Area (PHT): 3.53 cm     TR Peak grad:   36.0 mmHg MV Decel Time: 215 msec     TR Vmax:        300.00 cm/s MV E velocity: 101.00 cm/s MV A velocity: 50.10 cm/s   SHUNTS MV E/A ratio:  2.02         Systemic VTI:  0.22 m                             Systemic Diam: 2.10 cm Ida Rogue MD Electronically signed by Ida Rogue MD Signature Date/Time: 03/17/2021/5:50:33 PM    Final     EKG: sinus rhythm, IVCD  ASSESSMENT AND PLAN:  Hemoptysis, unclear etiology, possible secondary to pulmonary edema, though patient does not appear markedly fluid-overloaded.  Acute on chronic  systolic CHF, with down-trending BNP,  clinical improvement on IV Lasix. On Entresto and carvedilol Hypotension Atrial fibrillation, on amiodarone and carvedilol; Eliquis on hold Nonischemic cardiomyopathy with nonobstructive CAD per [facility] 2019, status post CRT-D  Recommendations: Resume Eliquis 5 mg BID for stroke prevention Continue Entresto 24-26 mg BID Reduce dose of carvedilol to 3.125 mg BID due to hypotension Continue amiodarone 200 mg daily Recommend discharging with oral Lasix 20 mg daily Recommend repeating metabolic panel as outpatient in 1-2 weeks Follow-up with cardiologist in 1-2 weeks 8.  Recommend discharge tonight or tomorrow.  Sign off for now; please call/[facility] with any questions.  Signed: Clabe Seal PA-C 03/18/2021, 1:54 PM

## 2021-03-19 DIAGNOSIS — R042 Hemoptysis: Secondary | ICD-10-CM | POA: Diagnosis not present

## 2021-03-19 LAB — BASIC METABOLIC PANEL
Anion gap: 7 (ref 5–15)
BUN: 42 mg/dL — ABNORMAL HIGH (ref 8–23)
CO2: 27 mmol/L (ref 22–32)
Calcium: 8.8 mg/dL — ABNORMAL LOW (ref 8.9–10.3)
Chloride: 103 mmol/L (ref 98–111)
Creatinine, Ser: 0.99 mg/dL (ref 0.61–1.24)
GFR, Estimated: >60 mL/min (ref >60)
Glucose, Bld: 107 mg/dL — ABNORMAL HIGH (ref 70–99)
Potassium: 3.6 mmol/L (ref 3.5–5.1)
Sodium: 137 mmol/L (ref 135–145)

## 2021-03-19 LAB — CBC
HCT: 33.4 % — ABNORMAL LOW (ref 39.0–52.0)
Hemoglobin: 10.9 g/dL — ABNORMAL LOW (ref 13.0–17.0)
MCH: 31.1 pg (ref 26.0–34.0)
MCHC: 32.6 g/dL (ref 30.0–36.0)
MCV: 95.2 fL (ref 80.0–100.0)
Platelets: 243 10*3/uL (ref 150–400)
RBC: 3.51 MIL/uL — ABNORMAL LOW (ref 4.22–5.81)
RDW: 15.4 % (ref 11.5–15.5)
WBC: 9 10*3/uL (ref 4.0–10.5)
nRBC: 0 % (ref 0.0–0.2)

## 2021-03-19 MED ORDER — CARVEDILOL 3.125 MG PO TABS: 3.1250 mg | ORAL_TABLET | Freq: Two times a day (BID) | ORAL | 1 refills | Status: AC

## 2021-03-19 MED ORDER — FUROSEMIDE 20 MG PO TABS: 20.0000 mg | ORAL_TABLET | Freq: Every day | ORAL | 11 refills | Status: AC

## 2021-03-19 NOTE — Plan of Care (Signed)
  Problem: Clinical Measurements: Goal: Ability to maintain clinical measurements within normal limits will improve Outcome: Progressing   

## 2021-03-19 NOTE — Discharge Summary (Signed)
Henry Moreno [id number] DOB: Jan 05, 1951 DOA: 03/15/2021  PCP: [name], MD  Admit date: 03/15/2021 Discharge date: 03/19/2021  Time spent: 35  minutes  Recommendations for Outpatient Follow-up:  Cardiology f/u 1 week Will need bmp in one week Pulmonary f/u as well     Discharge Diagnoses:  Principal Problem:   Hemoptysis Active Problems:   Atrial fibrillation ([place])   Chronic combined systolic and diastolic heart failure ([place])   Essential (primary) hypertension   Ventricular fibrillation ([place])   Acute respiratory failure with hypoxia [facility])   Discharge Condition: stable  Diet recommendation: heart healthy  Filed Weights   03/15/21 1510 03/19/21 0406  Weight: 90.7 kg 102.7 kg    History of present illness:  70 Y.O male with significant PMH of atrial fibrillation on anticoagulation with Eliquis, bronchitis, asthma, OSA on CPAP, nonischemic cardiomyopathy, V. fib arrest, chronic combined systolic and diastolic heart failure, ICD in place, HLD, hypertension, cervical DDD who presented to the ED with chief complaints of hemoptysis, shortness of breath and chest pain.   Patient states symptoms started today, he had three episode of coughing up bright red blood associated with chest pressure. He denies other associated symptoms of fever, chills, nausea or vomiting, or palpitation. Due to concerns of worsening symptoms, patient decided to call EMS.   Hospital Course:  Patient presented with non-massive hemoptysis. Bronchoscopy was normal. Thought to be 2/2 volume overload. Hemoptysis resolved by day of discharge. Patient was not with significant CHF exacerbation but did show some signs of volume overload. He was started on lasix. Cardiology saw, coreg dose decreased. His DOAC was resumed. He will f/u with cardiology in about 1 week. Ambulatory referral to pulmonology also placed.  Procedures: bronchoscopy  Consultations: Cardiology, pulmonology  Discharge  Exam: Vitals:   03/19/21 0359 03/19/21 0546  BP: 106/64 102/64  Pulse: 64 60  Resp:  20  Temp: 97.7 F (36.5 C) (!) 97.4 F (36.3 C)  SpO2: 92% 91%    General: NAD Cardiovascular: RRR Respiratory: CTAB Ext: trace pedal edema  Discharge Instructions   Discharge Instructions     Diet - low sodium heart healthy   Complete by: As directed    Increase activity slowly   Complete by: As directed       Allergies as of 03/19/2021       Reactions   Ace Inhibitors Cough   Ibuprofen Other (See Comments)   Other Reaction: HIVES / RASH        Medication List     STOP taking these medications    azelastine 0.1 % nasal spray Commonly known as: ASTELIN   benzonatate 200 MG capsule Commonly known as: TESSALON       TAKE these medications    acetaminophen 500 MG tablet Commonly known as: TYLENOL Take 1,000 mg by mouth 2 (two) times daily.   amiodarone 200 MG tablet Commonly known as: PACERONE Take 1 tablet (200 mg total) by mouth daily.   apixaban 5 MG Tabs tablet Commonly known as: ELIQUIS Take 5 mg by mouth 2 (two) times daily.   carvedilol 3.125 MG tablet Commonly known as: COREG Take 1 tablet (3.125 mg total) by mouth 2 (two) times daily with a meal. What changed:  medication strength See the new instructions.   cetirizine 10 MG tablet Commonly known as: ZYRTEC Take 10 mg by mouth at bedtime.   Entresto 24-26 MG Generic drug: sacubitril-valsartan Take 1 tablet by mouth 2 (two) times daily. What changed: Another  medication with the same name was removed. Continue taking this medication, and follow the directions you see here.   furosemide 20 MG tablet Commonly known as: Lasix Take 1 tablet (20 mg total) by mouth daily. What changed:  when to take this reasons to take this   Magnesium 200 MG Tabs Take 200 mg by mouth at bedtime.   Vitamin D3 75 MCG (3000 UT) Tabs Take 1 capsule by mouth daily. Taking a  2057m dose       Allergies   Allergen Reactions   Ace Inhibitors Cough   Ibuprofen Other (See Comments)    Other Reaction: HIVES / RASH    Follow-up Information     HBartolo Darter MD. Go in 1 week(s).   Specialty: Cardiology Why: Hospital discharge follow-up, acute on chronic systolic congestive heart failure Contact information: [facility] [phone]                 The results of significant diagnostics from this hospitalization (including imaging, microbiology, ancillary and laboratory) are listed below for reference.    Significant Diagnostic Studies: DG Chest 2 View  Result Date: 03/17/2021 CLINICAL DATA:  Acute on chronic respiratory failure with hypoxia EXAM: CHEST - 2 VIEW COMPARISON:  03/15/2021 FINDINGS: Cardiac shadow is stable. Defibrillator is again noted. Previously seen changes of CHF and edema have resolved in the interval. No focal infiltrate or effusion is seen. No bony abnormality is noted. IMPRESSION: No acute abnormality is noted. Previously seen CHF has resolved in the interval. Electronically Signed   By: MInez CatalinaM.D.   On: 03/17/2021 09:19   CT Angio Chest PE W and/or Wo Contrast  Result Date: 03/15/2021 CLINICAL DATA:  Hemoptysis and shortness of breath. EXAM: CT ANGIOGRAPHY CHEST WITH CONTRAST TECHNIQUE: Multidetector CT imaging of the chest was performed using the standard protocol during bolus administration of intravenous contrast. Multiplanar CT image reconstructions and MIPs were obtained to evaluate the vascular anatomy. CONTRAST:  723mOMNIPAQUE IOHEXOL 350 MG/ML SOLN COMPARISON:  None. FINDINGS: Cardiovascular: Pulmonary arteries are adequately opacified. There is some respiratory motion artifact throughout the study but significant pulmonary embolism is felt to be excluded. Central pulmonary arteries are normal in caliber. The thoracic aorta is normal in caliber. A biventricular pacing/ICD device present with  appropriate positioning of leads. Left ventricular cavity appears dilated. No pericardial fluid. Mediastinum/Nodes: No enlarged mediastinal, hilar, or axillary lymph nodes. Thyroid gland, trachea, and esophagus demonstrate no significant findings. Lungs/Pleura: Diffuse alveolar pulmonary opacity present throughout both upper lobes and in the superior segments of both lower lobes with some relative sparing of the lung bases. Associated trace amount right pleural fluid. Findings are suggestive of bilateral pulmonary hemorrhage. Component of pulmonary edema may also be present but there is no significant engorgement pulmonary arteries or veins. Component of underlying infection is not excluded such as an atypical infectious process. Upper Abdomen: No acute abnormality. Musculoskeletal: No chest wall abnormality. No acute or significant osseous findings. Review of the MIP images confirms the above findings. IMPRESSION: 1. No evidence of pulmonary embolism. 2. Diffuse alveolar pulmonary opacity throughout both upper lobes and the superior segments of both lower lobes with trace right pleural fluid. Findings are suggestive of pulmonary hemorrhage. Component of atypical pneumonia or pulmonary edema may also be present. Electronically Signed   By: GlAletta Edouard.D.   On: 03/15/2021 16:53   DG Chest Portable 1 View  Result Date: 03/15/2021 CLINICAL DATA:  Chest pain and shortness of breath EXAM: PORTABLE CHEST 1 VIEW COMPARISON:  11/12/2017 FINDINGS: Cardiac shadow is enlarged. Defibrillator is again noted and stable. Increased vascular congestion is noted with patchy airspace opacity likely representing edema. No sizable effusion is seen. No pneumothorax is noted. IMPRESSION: Changes consistent with CHF and patchy parenchymal edema bilaterally. Electronically Signed   By: Inez Catalina M.D.   On: 03/15/2021 16:02   ECHOCARDIOGRAM COMPLETE BUBBLE STUDY  Result Date: 03/17/2021    ECHOCARDIOGRAM REPORT   Patient  Name:   Domanic Capote Date of Exam: 03/17/2021 Medical Rec #:  [id number]       Height:       68.0 in Accession #:    [id number]      Weight:       200.0 lb Date of Birth:  [birth year]       BSA:          2.044 m Patient Age:    41 years        BP:           113/56 mmHg Patient Gender: M               HR:           67 bpm. Exam Location:  [facility] Procedure: 2D Echo, Cardiac Doppler, Color Doppler, Saline Contrast Bubble Study            and Intracardiac Opacification Agent Indications:     Systolic heart failure  History:         Patient has prior history of Echocardiogram examinations, most                  recent 11/13/2017. Arrythmias:Atrial Fibrillation; Risk                  Factors:Hypertension. Non-ischemic cardiomyopathy.  Sonographer:     Sherrie Sport RDCS (AE) Referring Phys:  [id number] ADAM Sharene Butters Diagnosing Phys: Ida Rogue MD  Sonographer Comments: Suboptimal apical window. IMPRESSIONS  1. Left ventricular ejection fraction, by estimation, is 25 to 30%. The left ventricle has severely decreased function. The left ventricle demonstrates global hypokinesis. The left ventricular internal cavity size was moderately dilated. Left ventricular diastolic parameters are indeterminate.  2. Right ventricular systolic function is normal. The right ventricular size is normal.  3. Left atrial size was moderately dilated.  4. The mitral valve is normal in structure. Mild to moderate mitral valve regurgitation.  5. Agitated saline contrast bubble study was negative, with no evidence of any interatrial shunt. FINDINGS  Left Ventricle: Left ventricular ejection fraction, by estimation, is 25 to 30%. The left ventricle has severely decreased function. The left ventricle demonstrates global hypokinesis. Definity contrast agent was given IV to delineate the left ventricular endocardial borders. The left ventricular internal cavity size was moderately dilated. There is no left ventricular hypertrophy. Left ventricular  diastolic parameters are indeterminate. Right Ventricle: The right ventricular size is normal. No increase in right ventricular wall thickness. Right ventricular systolic function is normal. Left Atrium: Left atrial size was moderately dilated. Right Atrium: Right atrial size was normal in size. Pericardium: There is no evidence of pericardial effusion. Mitral Valve: The mitral valve is normal in structure. Mild to moderate mitral valve regurgitation. No evidence of mitral valve stenosis. Tricuspid Valve: The tricuspid valve is normal in structure. Tricuspid valve regurgitation is mild . No evidence of tricuspid stenosis. Aortic Valve: The aortic valve is normal in structure. Aortic valve regurgitation is  not visualized. No aortic stenosis is present. Aortic valve mean gradient measures 3.0 mmHg. Aortic valve peak gradient measures 5.6 mmHg. Aortic valve area, by VTI measures 3.86 cm. Pulmonic Valve: The pulmonic valve was normal in structure. Pulmonic valve regurgitation is not visualized. No evidence of pulmonic stenosis. Aorta: The aortic root is normal in size and structure. Venous: The inferior vena cava is normal in size with greater than 50% respiratory variability, suggesting right atrial pressure of 3 mmHg. IAS/Shunts: No atrial level shunt detected by color flow Doppler. Agitated saline contrast was given intravenously to evaluate for intracardiac shunting. Agitated saline contrast bubble study was negative, with no evidence of any interatrial shunt. Additional Comments: A device lead is visualized.  LEFT VENTRICLE PLAX 2D LVIDd:         5.64 cm LVIDs:         4.36 cm LV PW:         1.09 cm LV IVS:        0.80 cm LVOT diam:     2.10 cm LV SV:         78 LV SV Index:   38 LVOT Area:     3.46 cm  LV Volumes (MOD) LV vol d, MOD A2C: 224.0 ml LV vol d, MOD A4C: 257.0 ml LV vol s, MOD A2C: 160.0 ml LV vol s, MOD A4C: 214.0 ml LV SV MOD A2C:     64.0 ml LV SV MOD A4C:     257.0 ml LV SV MOD BP:      53.5 ml  RIGHT VENTRICLE RV Basal diam:  3.24 cm LEFT ATRIUM             Index       RIGHT ATRIUM           Index LA diam:        5.20 cm 2.54 cm/m  RA Area:     16.30 cm LA Vol (A2C):   91.5 ml 44.77 ml/m RA Volume:   43.70 ml  21.38 ml/m LA Vol (A4C):   77.0 ml 37.68 ml/m LA Biplane Vol: 91.0 ml 44.53 ml/m  AORTIC VALVE                   PULMONIC VALVE AV Area (Vmax):    3.02 cm    PV Vmax:        0.98 m/s AV Area (Vmean):   3.27 cm    PV Peak grad:   3.8 mmHg AV Area (VTI):     3.86 cm    RVOT Peak grad: 4 mmHg AV Vmax:           118.00 cm/s AV Vmean:          77.100 cm/s AV VTI:            0.201 m AV Peak Grad:      5.6 mmHg AV Mean Grad:      3.0 mmHg LVOT Vmax:         103.00 cm/s LVOT Vmean:        72.900 cm/s LVOT VTI:          0.224 m LVOT/AV VTI ratio: 1.11  AORTA Ao Root diam: 2.80 cm MITRAL VALVE                TRICUSPID VALVE MV Area (PHT): 3.53 cm     TR Peak grad:   36.0 mmHg MV Decel Time: 215 msec  TR Vmax:        300.00 cm/s MV E velocity: 101.00 cm/s MV A velocity: 50.10 cm/s   SHUNTS MV E/A ratio:  2.02         Systemic VTI:  0.22 m                             Systemic Diam: 2.10 cm Ida Rogue MD Electronically signed by Ida Rogue MD Signature Date/Time: 03/17/2021/5:50:33 PM    Final     Microbiology: Recent Results (from the past 240 hour(s))  Resp Panel by RT-PCR (Flu A&B, Covid) Nasopharyngeal Swab     Status: None   Collection Time: 03/15/21  3:35 PM   Specimen: Nasopharyngeal Swab; Nasopharyngeal(NP) swabs in vial transport medium  Result Value Ref Range Status   SARS Coronavirus 2 by RT PCR NEGATIVE NEGATIVE Final    Comment: (NOTE) SARS-CoV-2 target nucleic acids are NOT DETECTED.  The SARS-CoV-2 RNA is generally detectable in upper respiratory specimens during the acute phase of infection. The lowest concentration of SARS-CoV-2 viral copies this assay can detect is 138 copies/mL. A negative result does not preclude SARS-Cov-2 infection and should not be used  as the sole basis for treatment or other patient management decisions. A negative result may occur with  improper specimen collection/handling, submission of specimen other than nasopharyngeal swab, presence of viral mutation(s) within the areas targeted by this assay, and inadequate number of viral copies(<138 copies/mL). A negative result must be combined with clinical observations, patient history, and epidemiological information. The expected result is Negative.  Fact Sheet for Patients:  EntrepreneurPulse.com.au  Fact Sheet for Healthcare Providers:  IncredibleEmployment.be  This test is no t yet approved or cleared by the [place] FDA and  has been authorized for detection and/or diagnosis of SARS-CoV-2 by FDA under an Emergency Use Authorization (EUA). This EUA will remain  in effect (meaning this test can be used) for the duration of the COVID-19 declaration under Section 564(b)(1) of the Act, 21 U.S.C.section 360bbb-3(b)(1), unless the authorization is terminated  or revoked sooner.       Influenza A by PCR NEGATIVE NEGATIVE Final   Influenza B by PCR NEGATIVE NEGATIVE Final    Comment: (NOTE) The Xpert Xpress SARS-CoV-2/FLU/RSV plus assay is intended as an aid in the diagnosis of influenza from Nasopharyngeal swab specimens and should not be used as a sole basis for treatment. Nasal washings and aspirates are unacceptable for Xpert Xpress SARS-CoV-2/FLU/RSV testing.  Fact Sheet for Patients: EntrepreneurPulse.com.au  Fact Sheet for Healthcare Providers: IncredibleEmployment.be  This test is not yet approved or cleared by the [place] FDA and has been authorized for detection and/or diagnosis of SARS-CoV-2 by FDA under an Emergency Use Authorization (EUA). This EUA will remain in effect (meaning this test can be used) for the duration of the COVID-19 declaration under Section 564(b)(1)  of the Act, 21 U.S.C. section 360bbb-3(b)(1), unless the authorization is terminated or revoked.  Performed at [facility], [address] [place], [place] [address]   Blood culture (routine x 2)     Status: None (Preliminary result)   Collection Time: 03/15/21  5:31 PM   Specimen: BLOOD  Result Value Ref Range Status   Specimen Description BLOOD LW  Final   Special Requests   Final    BOTTLES DRAWN AEROBIC AND ANAEROBIC Blood Culture adequate volume   Culture   Final    NO GROWTH 4  DAYS Performed at [facility], [address] [place], [place] [address]    Report Status PENDING  Incomplete  Blood culture (routine x 2)     Status: None (Preliminary result)   Collection Time: 03/15/21  5:31 PM   Specimen: BLOOD  Result Value Ref Range Status   Specimen Description BLOOD RH  Final   Special Requests   Final    BOTTLES DRAWN AEROBIC AND ANAEROBIC Blood Culture results may not be optimal due to an inadequate volume of blood received in culture bottles   Culture   Final    NO GROWTH 4 DAYS Performed at [facility], [address], [place], [place] [address]    Report Status PENDING  Incomplete  MRSA Next Gen by PCR, Nasal     Status: None   Collection Time: 03/15/21 11:53 PM   Specimen: Nasal Mucosa; Nasal Swab  Result Value Ref Range Status   MRSA by PCR Next Gen NOT DETECTED NOT DETECTED Final    Comment: (NOTE) The GeneXpert MRSA Assay (FDA approved for NASAL specimens only), is one component of a comprehensive MRSA colonization surveillance program. It is not intended to diagnose MRSA infection nor to guide or monitor treatment for MRSA infections. Test performance is not FDA approved in patients less than 36 years old. Performed at [facility], [place], [place] [address]   Acid Fast Smear (AFB)     Status: None   Collection Time: 03/16/21  9:17 AM   Specimen: Bronchial Alveolar Lavage  Result Value Ref Range  Status   AFB Specimen Processing Concentration  Final   Acid Fast Smear Negative  Final    Comment: (NOTE) Performed At: [facility] [place], [place] [id number] Rush Farmer MD IO:[id number]    Source (AFB) BRONCHIAL ALVEOLAR LAVAGE  Final    Comment: Performed at [facility], [address] [place], [place] [address]  Culture, Respiratory w Gram Stain     Status: None   Collection Time: 03/16/21  9:25 AM   Specimen: Bronchoalveolar Lavage; Respiratory  Result Value Ref Range Status   Specimen Description   Final    BRONCHIAL ALVEOLAR LAVAGE Performed at [facility], [address] [place], [place] [address]    Special Requests   Final    NONE Performed at [facility], [place], [place] [address]    Gram Stain NO WBC SEEN NO ORGANISMS SEEN   Final   Culture   Final    RARE Normal respiratory flora-no Staph aureus or Pseudomonas seen Performed at [facility] [address], [place], [place] [address]    Report Status 03/18/2021 FINAL  Final     Labs: Basic Metabolic Panel: Recent Labs  Lab 03/15/21 1514 03/15/21 2007 03/16/21 0449 03/17/21 0922 03/18/21 0431 03/19/21 0503  NA 136  --  136 137 138 137  K 4.5  --  4.6 4.2 3.6 3.6  CL 106  --  105 106 104 103  CO2 22  --  _0 GLUCOSE 136*  --  139* 100* 101* 107*  BUN 27*  --  20 39* 50* 42*  CREATININE 0.92  --  0.83 1.22 0.96 0.99  CALCIUM 8.6*  --  8.6* 8.9 8.7* 8.8*  MG  --  2.2 2.1  --   --   --   PHOS  --  3.2 2.7  --   --   --  Liver Function Tests: No results for input(s): AST, ALT, ALKPHOS, BILITOT, PROT, ALBUMIN in the last 168 hours. No results for input(s): LIPASE, AMYLASE in the last 168 hours. No results for input(s): AMMONIA in the last 168 hours. CBC: Recent Labs  Lab 03/15/21 1514 03/16/21 0449 03/17/21 0922 03/19/21 0503  WBC 8.1 9.3 11.2* 9.0  NEUTROABS 5.7  --   --   --   HGB 12.6* 11.6*  11.5* 10.9*  HCT 38.8* 36.3* 35.1* 33.4*  MCV 95.8 95.5 95.4 95.2  PLT 271 222 240 243   Cardiac Enzymes: No results for input(s): CKTOTAL, CKMB, CKMBINDEX, TROPONINI in the last 168 hours. BNP: BNP (last 3 results) Recent Labs    03/15/21 1514 03/17/21 0922  BNP 382.9* 214.4*    ProBNP (last 3 results) No results for input(s): PROBNP in the last 8760 hours.  CBG: Recent Labs  Lab 03/15/21 2132  GLUCAP 89       Signed:  Desma Maxim MD.  [facility] 03/19/2021, 9:01 AM

## 2021-03-19 NOTE — TOC Progression Note (Signed)
Transition of Care [facility]) - Progression Note    Patient Details  Name: Henry Moreno MRN: [id number] Date of Birth: April 10, 1951  Transition of Care [facility]) CM/SW [place], RN Phone Number: 03/19/2021, 9:27 AM  Clinical Narrative:   Patient to discharge this afternoon.  Mr. Pioli states he lives at home with his wife, who can assist if needed.  Patient has home CPAP and states he uses it without concerns.  He has no concerns about transporting to appointments, or obtaining or taking medications.  He has no current home health and states he does not anticipate having any needs for these services.  He currently has no concerns about returning home at discharge.         Expected Discharge Plan and Services           Expected Discharge Date: 03/19/21                                     Social Determinants of Health (SDOH) Interventions    Readmission Risk Interventions No flowsheet data found.

## 2021-03-21 LAB — CULTURE, BLOOD (ROUTINE X 2)
Culture: NO GROWTH
Culture: NO GROWTH
Special Requests: ADEQUATE

## 2021-04-19 LAB — FUNGAL ORGANISM REFLEX

## 2021-04-19 LAB — FUNGUS CULTURE RESULT

## 2021-04-19 LAB — FUNGUS CULTURE WITH STAIN

## 2021-04-30 LAB — ACID FAST CULTURE WITH REFLEXED SENSITIVITIES (MYCOBACTERIA): Acid Fast Culture: NEGATIVE

## 2023-03-14 IMAGING — CR DG CHEST 2V
1 series · 2 of 2 positions shown · non-contrast
Comparison: 03/15/2021

CLINICAL DATA: Acute on chronic respiratory failure with hypoxia

EXAM:
CHEST - 2 VIEW

[Series 1: dg chest 2 view · 0.14mm/px · 2 of 2 slices shown]
[im 1/2]
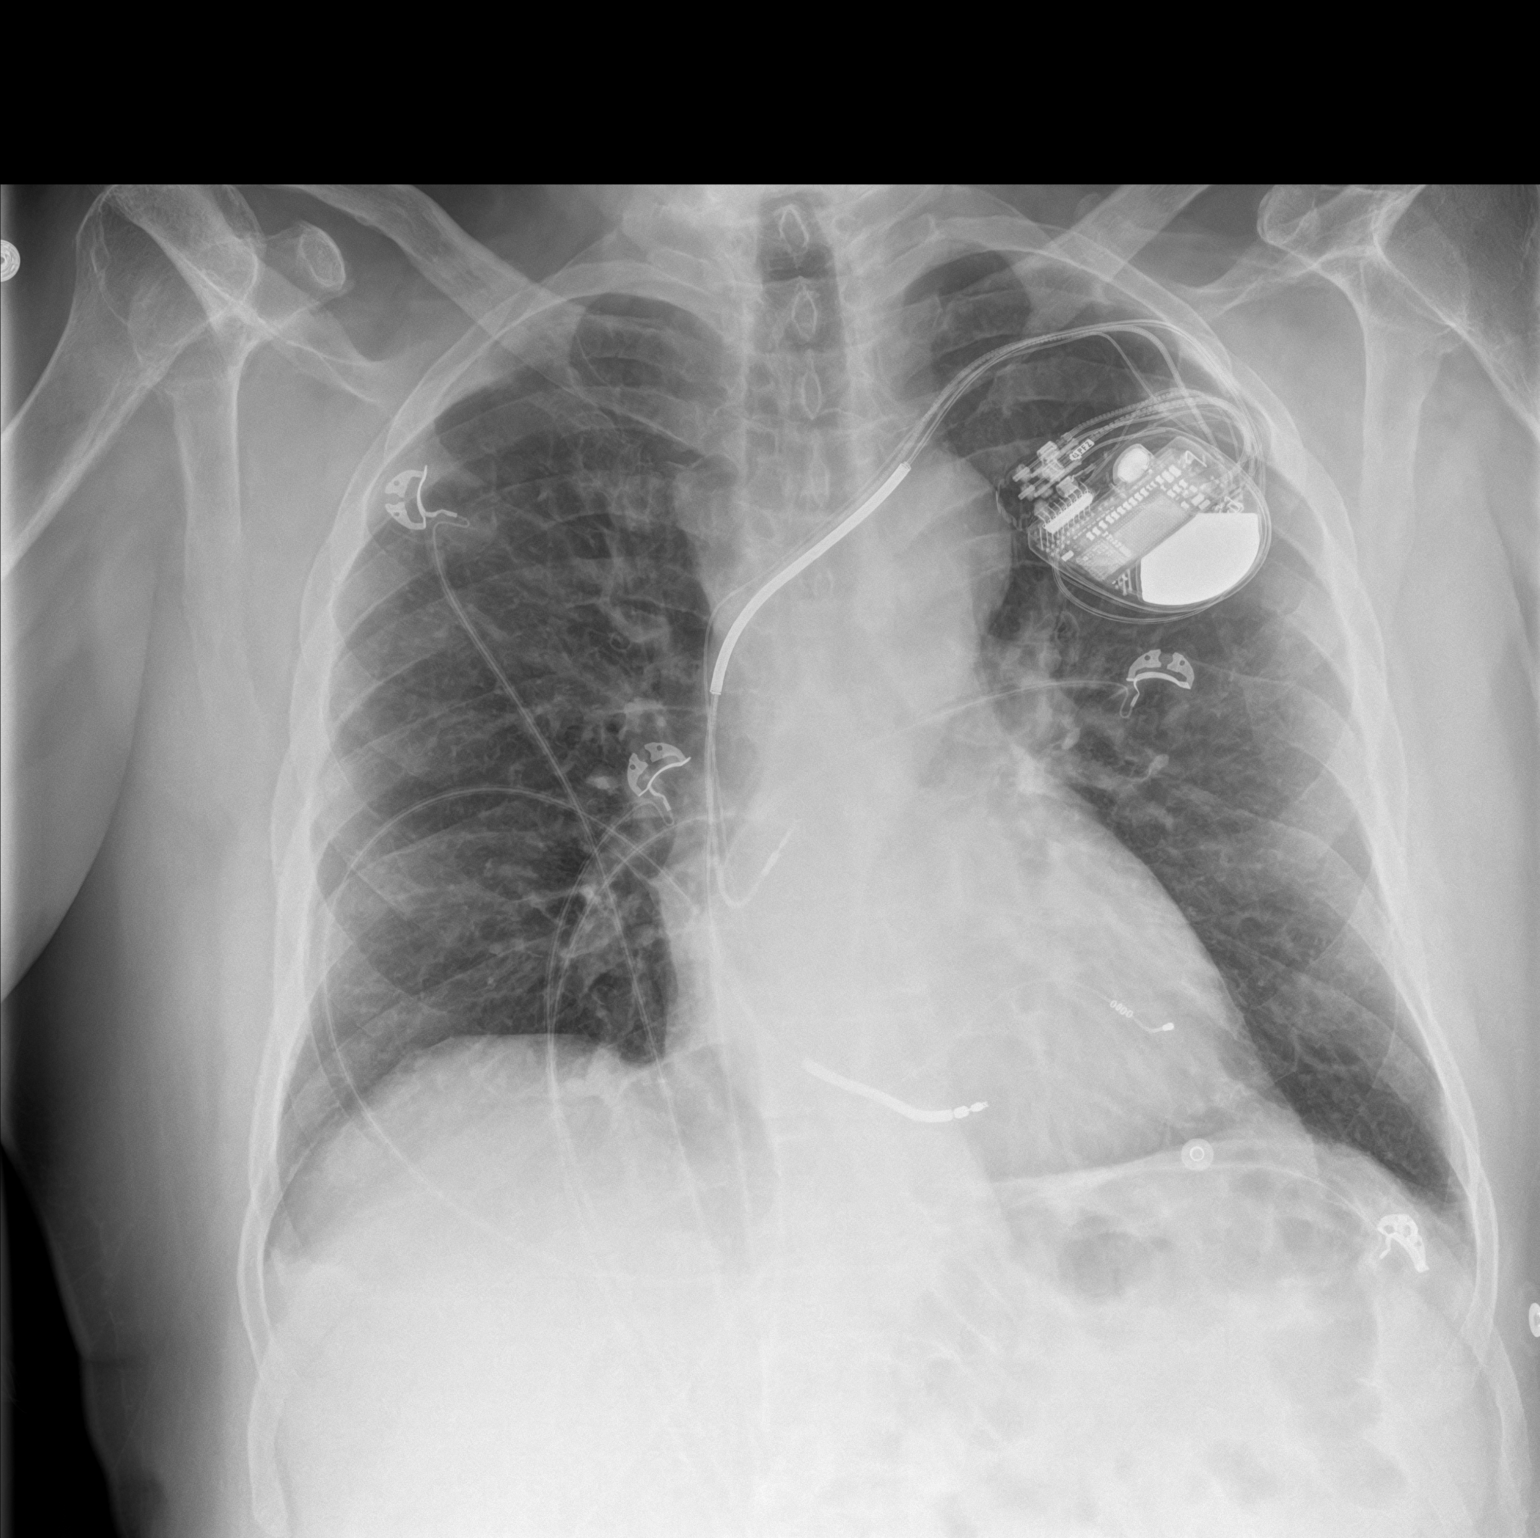
[im 2/2]
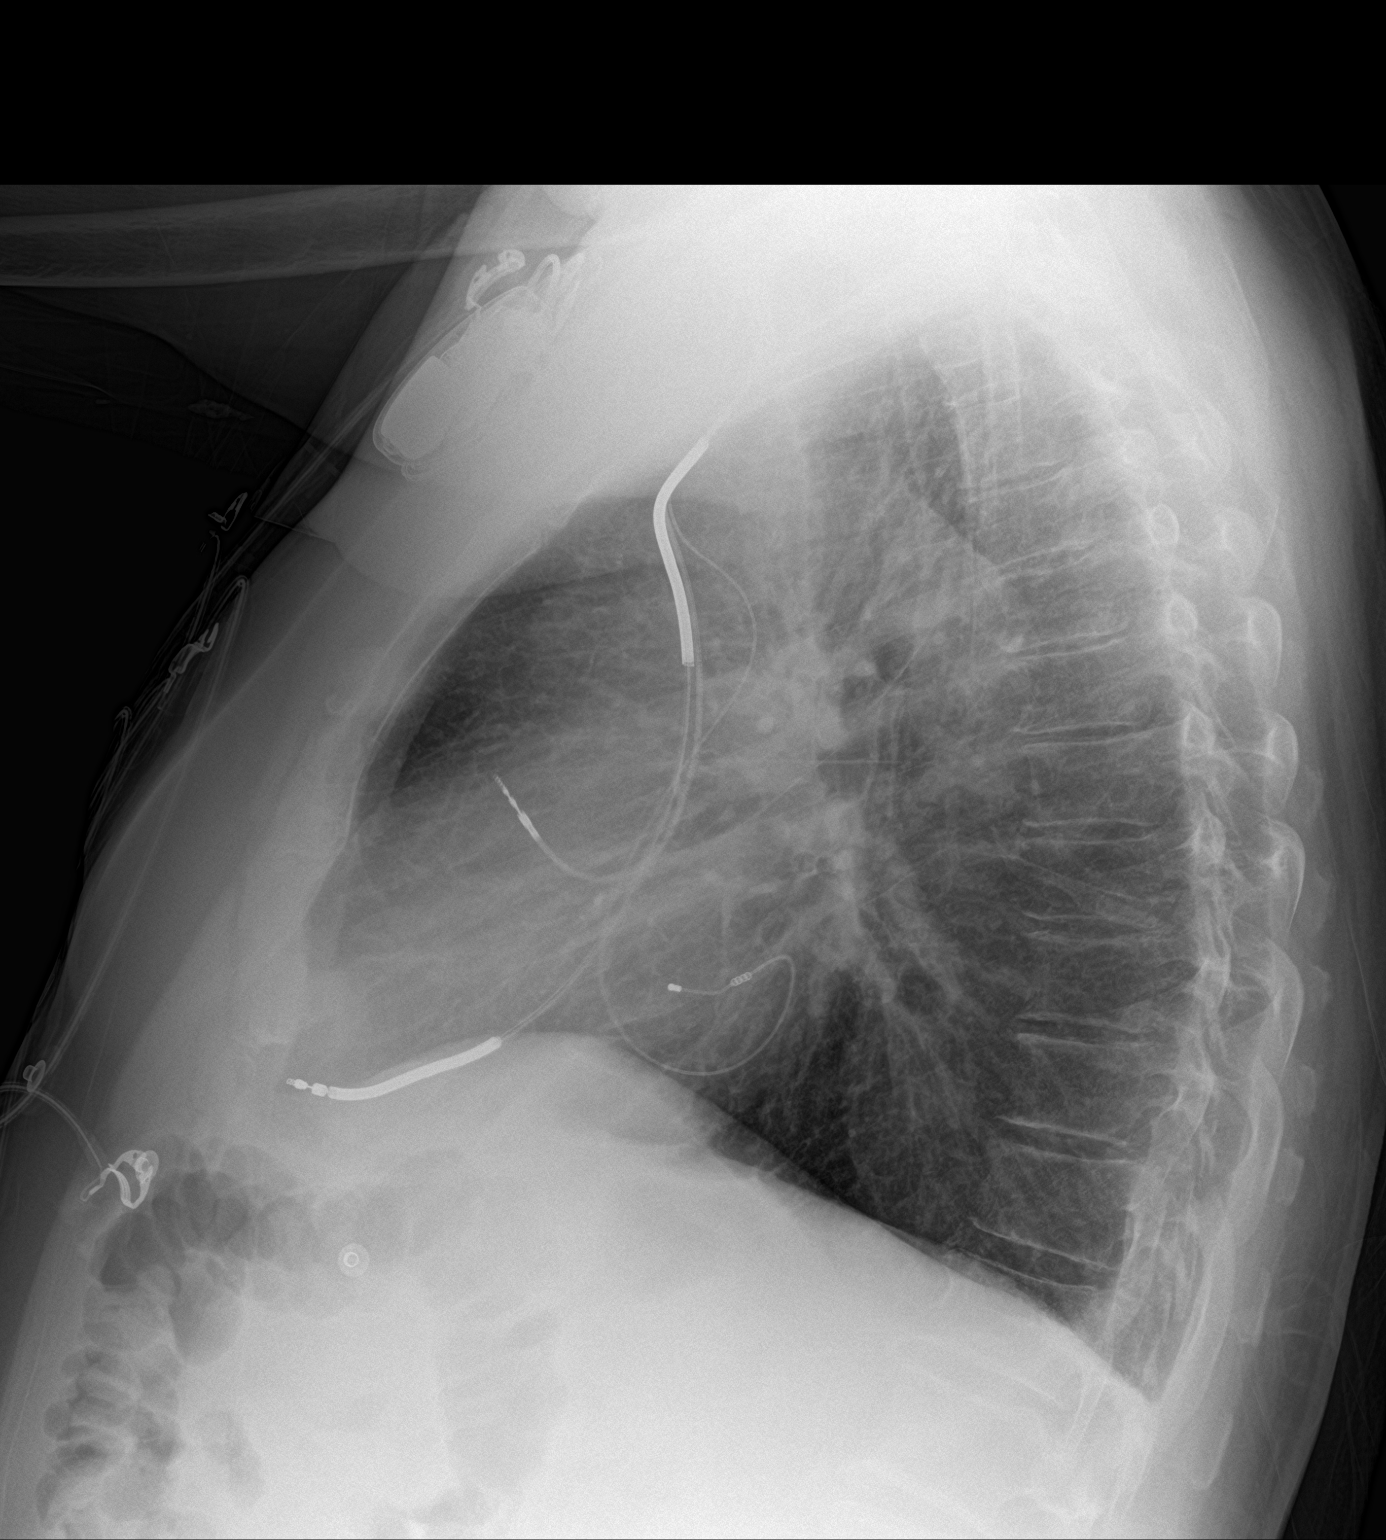

[2 of 2 positions shown; findings below may reference images not displayed]

FINDINGS: Cardiac shadow is stable. Defibrillator is again noted. Previously
seen changes of CHF and edema have resolved in the interval. No
focal infiltrate or effusion is seen. No bony abnormality is noted.
IMPRESSION: No acute abnormality is noted. Previously seen CHF has resolved in
the interval.

## 2024-04-07 ENCOUNTER — Encounter: Payer: Self-pay | Admitting: Dermatology

## 2024-04-07 ENCOUNTER — Ambulatory Visit: Admitting: Dermatology

## 2024-04-07 DIAGNOSIS — L821 Other seborrheic keratosis: Secondary | ICD-10-CM | POA: Diagnosis not present

## 2024-04-07 NOTE — Patient Instructions (Signed)

## 2024-04-07 NOTE — Progress Notes (Signed)
   New Patient Visit   Subjective  Xhaiden Coombs is a 73 y.o. male who presents for the following: spot at left neck, present for 6-8 months, sometimes itches. No hx skin cancer.   The following portions of the chart were reviewed this encounter and updated as appropriate: medications, allergies, medical history  Review of Systems:  No other skin or systemic complaints except as noted in HPI or Assessment and Plan.  Objective  Well appearing patient in no apparent distress; mood and affect are within normal limits.   A focused examination was performed of the following areas: neck  Relevant exam findings are noted in the Assessment and Plan.    Assessment & Plan   SEBORRHEIC KERATOSIS - Stuck-on, waxy, tan-brown papules and/or plaques  - Benign-appearing - Discussed benign etiology and prognosis. - Observe - Call for any changes   SEBORRHEIC KERATOSES    Return if symptoms worsen or fail to improve.  LILLETTE Lonell Drones, RMA, am acting as scribe for Boneta Sharps, MD .   Documentation: I have reviewed the above documentation for accuracy and completeness, and I agree with the above.  Boneta Sharps, MD
